# Patient Record
Sex: Male | Born: 1950 | Race: White | Hispanic: No | Marital: Single | Smoking: Never smoker
Health system: Southern US, Community
[De-identification: ages and names within clinical notes are randomized; demographics above are authoritative.]

## PROBLEM LIST (undated history)

## (undated) DIAGNOSIS — Z87440 Personal history of urinary (tract) infections: Secondary | ICD-10-CM

## (undated) DIAGNOSIS — N401 Enlarged prostate with lower urinary tract symptoms: Secondary | ICD-10-CM

## (undated) DIAGNOSIS — N400 Enlarged prostate without lower urinary tract symptoms: Secondary | ICD-10-CM

## (undated) DIAGNOSIS — N138 Other obstructive and reflux uropathy: Secondary | ICD-10-CM

## (undated) DIAGNOSIS — I351 Nonrheumatic aortic (valve) insufficiency: Secondary | ICD-10-CM

## (undated) HISTORY — PX: APPENDECTOMY: SHX54

## (undated) HISTORY — PX: BASAL CELL CARCINOMA EXCISION: SHX1214

## (undated) HISTORY — PX: HERNIA REPAIR: SHX51

## (undated) HISTORY — PX: SQUAMOUS CELL CARCINOMA EXCISION: SHX2433

---

## 2016-04-14 DIAGNOSIS — Z85828 Personal history of other malignant neoplasm of skin: Secondary | ICD-10-CM | POA: Diagnosis not present

## 2016-04-14 DIAGNOSIS — L570 Actinic keratosis: Secondary | ICD-10-CM | POA: Diagnosis not present

## 2016-04-14 DIAGNOSIS — L2081 Atopic neurodermatitis: Secondary | ICD-10-CM | POA: Diagnosis not present

## 2016-04-14 DIAGNOSIS — L111 Transient acantholytic dermatosis [Grover]: Secondary | ICD-10-CM | POA: Diagnosis not present

## 2016-04-14 DIAGNOSIS — Z08 Encounter for follow-up examination after completed treatment for malignant neoplasm: Secondary | ICD-10-CM | POA: Diagnosis not present

## 2016-04-14 DIAGNOSIS — X32XXXS Exposure to sunlight, sequela: Secondary | ICD-10-CM | POA: Diagnosis not present

## 2016-06-28 DIAGNOSIS — L301 Dyshidrosis [pompholyx]: Secondary | ICD-10-CM | POA: Diagnosis not present

## 2016-07-19 DIAGNOSIS — K219 Gastro-esophageal reflux disease without esophagitis: Secondary | ICD-10-CM | POA: Diagnosis not present

## 2016-07-19 DIAGNOSIS — Z8371 Family history of colonic polyps: Secondary | ICD-10-CM | POA: Diagnosis not present

## 2016-07-19 DIAGNOSIS — Z01818 Encounter for other preprocedural examination: Secondary | ICD-10-CM | POA: Diagnosis not present

## 2016-09-01 DIAGNOSIS — K210 Gastro-esophageal reflux disease with esophagitis: Secondary | ICD-10-CM | POA: Diagnosis not present

## 2016-09-01 DIAGNOSIS — K219 Gastro-esophageal reflux disease without esophagitis: Secondary | ICD-10-CM | POA: Diagnosis not present

## 2016-09-01 DIAGNOSIS — K2970 Gastritis, unspecified, without bleeding: Secondary | ICD-10-CM | POA: Diagnosis not present

## 2016-09-01 DIAGNOSIS — K209 Esophagitis, unspecified: Secondary | ICD-10-CM | POA: Diagnosis not present

## 2016-09-01 DIAGNOSIS — K644 Residual hemorrhoidal skin tags: Secondary | ICD-10-CM | POA: Diagnosis not present

## 2016-09-01 DIAGNOSIS — Z8371 Family history of colonic polyps: Secondary | ICD-10-CM | POA: Diagnosis not present

## 2016-09-01 DIAGNOSIS — K635 Polyp of colon: Secondary | ICD-10-CM | POA: Diagnosis not present

## 2016-09-01 DIAGNOSIS — K229 Disease of esophagus, unspecified: Secondary | ICD-10-CM | POA: Diagnosis not present

## 2016-09-01 DIAGNOSIS — Z1211 Encounter for screening for malignant neoplasm of colon: Secondary | ICD-10-CM | POA: Diagnosis not present

## 2016-09-01 DIAGNOSIS — D120 Benign neoplasm of cecum: Secondary | ICD-10-CM | POA: Diagnosis not present

## 2016-09-01 DIAGNOSIS — K222 Esophageal obstruction: Secondary | ICD-10-CM | POA: Diagnosis not present

## 2016-09-26 DIAGNOSIS — Z8371 Family history of colonic polyps: Secondary | ICD-10-CM | POA: Diagnosis not present

## 2016-09-26 DIAGNOSIS — K219 Gastro-esophageal reflux disease without esophagitis: Secondary | ICD-10-CM | POA: Diagnosis not present

## 2016-09-26 DIAGNOSIS — K222 Esophageal obstruction: Secondary | ICD-10-CM | POA: Diagnosis not present

## 2016-09-26 DIAGNOSIS — Z86010 Personal history of colonic polyps: Secondary | ICD-10-CM | POA: Diagnosis not present

## 2016-09-26 DIAGNOSIS — K2970 Gastritis, unspecified, without bleeding: Secondary | ICD-10-CM | POA: Diagnosis not present

## 2016-09-26 DIAGNOSIS — K449 Diaphragmatic hernia without obstruction or gangrene: Secondary | ICD-10-CM | POA: Diagnosis not present

## 2016-09-26 DIAGNOSIS — K210 Gastro-esophageal reflux disease with esophagitis: Secondary | ICD-10-CM | POA: Diagnosis not present

## 2016-12-06 DIAGNOSIS — B349 Viral infection, unspecified: Secondary | ICD-10-CM | POA: Diagnosis not present

## 2016-12-06 DIAGNOSIS — Z2889 Immunization not carried out for other reason: Secondary | ICD-10-CM | POA: Diagnosis not present

## 2016-12-06 DIAGNOSIS — Z6823 Body mass index (BMI) 23.0-23.9, adult: Secondary | ICD-10-CM | POA: Diagnosis not present

## 2017-01-03 DIAGNOSIS — L570 Actinic keratosis: Secondary | ICD-10-CM | POA: Diagnosis not present

## 2017-01-03 DIAGNOSIS — Z08 Encounter for follow-up examination after completed treatment for malignant neoplasm: Secondary | ICD-10-CM | POA: Diagnosis not present

## 2017-01-03 DIAGNOSIS — L853 Xerosis cutis: Secondary | ICD-10-CM | POA: Diagnosis not present

## 2017-01-03 DIAGNOSIS — Z85828 Personal history of other malignant neoplasm of skin: Secondary | ICD-10-CM | POA: Diagnosis not present

## 2017-01-03 DIAGNOSIS — L2081 Atopic neurodermatitis: Secondary | ICD-10-CM | POA: Diagnosis not present

## 2017-03-28 DIAGNOSIS — H9193 Unspecified hearing loss, bilateral: Secondary | ICD-10-CM | POA: Diagnosis not present

## 2017-03-28 DIAGNOSIS — Z6822 Body mass index (BMI) 22.0-22.9, adult: Secondary | ICD-10-CM | POA: Diagnosis not present

## 2017-03-28 DIAGNOSIS — Z0000 Encounter for general adult medical examination without abnormal findings: Secondary | ICD-10-CM | POA: Diagnosis not present

## 2017-04-04 DIAGNOSIS — D649 Anemia, unspecified: Secondary | ICD-10-CM | POA: Diagnosis not present

## 2017-04-14 DIAGNOSIS — R300 Dysuria: Secondary | ICD-10-CM | POA: Diagnosis not present

## 2017-04-14 DIAGNOSIS — Z6823 Body mass index (BMI) 23.0-23.9, adult: Secondary | ICD-10-CM | POA: Diagnosis not present

## 2017-04-14 DIAGNOSIS — N410 Acute prostatitis: Secondary | ICD-10-CM | POA: Diagnosis not present

## 2017-07-10 DIAGNOSIS — J040 Acute laryngitis: Secondary | ICD-10-CM | POA: Diagnosis not present

## 2017-07-10 DIAGNOSIS — K219 Gastro-esophageal reflux disease without esophagitis: Secondary | ICD-10-CM | POA: Diagnosis not present

## 2017-07-10 DIAGNOSIS — H6123 Impacted cerumen, bilateral: Secondary | ICD-10-CM | POA: Diagnosis not present

## 2017-08-18 ENCOUNTER — Encounter (HOSPITAL_COMMUNITY): Payer: Self-pay

## 2017-08-18 ENCOUNTER — Emergency Department (HOSPITAL_COMMUNITY): Payer: Federal, State, Local not specified - PPO

## 2017-08-18 ENCOUNTER — Other Ambulatory Visit: Payer: Self-pay

## 2017-08-18 ENCOUNTER — Observation Stay (HOSPITAL_COMMUNITY)
Admission: EM | Admit: 2017-08-18 | Discharge: 2017-08-20 | Disposition: A | Discharge status: Home / Self Care | Payer: Federal, State, Local not specified - PPO | Attending: Internal Medicine | Admitting: Internal Medicine

## 2017-08-18 DIAGNOSIS — Z79899 Other long term (current) drug therapy: Secondary | ICD-10-CM | POA: Insufficient documentation

## 2017-08-18 DIAGNOSIS — N401 Enlarged prostate with lower urinary tract symptoms: Secondary | ICD-10-CM | POA: Insufficient documentation

## 2017-08-18 DIAGNOSIS — M7989 Other specified soft tissue disorders: Secondary | ICD-10-CM | POA: Diagnosis not present

## 2017-08-18 DIAGNOSIS — R600 Localized edema: Secondary | ICD-10-CM | POA: Diagnosis present

## 2017-08-18 DIAGNOSIS — R030 Elevated blood-pressure reading, without diagnosis of hypertension: Secondary | ICD-10-CM | POA: Diagnosis present

## 2017-08-18 DIAGNOSIS — I351 Nonrheumatic aortic (valve) insufficiency: Secondary | ICD-10-CM | POA: Diagnosis not present

## 2017-08-18 DIAGNOSIS — K219 Gastro-esophageal reflux disease without esophagitis: Secondary | ICD-10-CM | POA: Insufficient documentation

## 2017-08-18 DIAGNOSIS — N1330 Unspecified hydronephrosis: Principal | ICD-10-CM | POA: Diagnosis present

## 2017-08-18 DIAGNOSIS — R3915 Urgency of urination: Secondary | ICD-10-CM | POA: Diagnosis not present

## 2017-08-18 DIAGNOSIS — R9431 Abnormal electrocardiogram [ECG] [EKG]: Secondary | ICD-10-CM | POA: Diagnosis not present

## 2017-08-18 DIAGNOSIS — R339 Retention of urine, unspecified: Secondary | ICD-10-CM | POA: Diagnosis present

## 2017-08-18 DIAGNOSIS — N320 Bladder-neck obstruction: Secondary | ICD-10-CM | POA: Insufficient documentation

## 2017-08-18 DIAGNOSIS — R338 Other retention of urine: Secondary | ICD-10-CM | POA: Diagnosis not present

## 2017-08-18 DIAGNOSIS — Z91040 Latex allergy status: Secondary | ICD-10-CM | POA: Diagnosis not present

## 2017-08-18 DIAGNOSIS — R300 Dysuria: Secondary | ICD-10-CM | POA: Insufficient documentation

## 2017-08-18 DIAGNOSIS — N134 Hydroureter: Secondary | ICD-10-CM | POA: Insufficient documentation

## 2017-08-18 DIAGNOSIS — N400 Enlarged prostate without lower urinary tract symptoms: Secondary | ICD-10-CM | POA: Diagnosis present

## 2017-08-18 HISTORY — DX: Benign prostatic hyperplasia with lower urinary tract symptoms: N40.1

## 2017-08-18 HISTORY — DX: Benign prostatic hyperplasia without lower urinary tract symptoms: N40.0

## 2017-08-18 HISTORY — DX: Nonrheumatic aortic (valve) insufficiency: I35.1

## 2017-08-18 HISTORY — DX: Other obstructive and reflux uropathy: N13.8

## 2017-08-18 HISTORY — DX: Personal history of urinary (tract) infections: Z87.440

## 2017-08-18 LAB — CBC
HEMATOCRIT: 34.3 % — AB (ref 39.0–52.0)
HEMOGLOBIN: 11.3 g/dL — AB (ref 13.0–17.0)
MCH: 31 pg (ref 26.0–34.0)
MCHC: 32.9 g/dL (ref 30.0–36.0)
MCV: 94.2 fL (ref 78.0–100.0)
Platelets: 246 10*3/uL (ref 150–400)
RBC: 3.64 MIL/uL — ABNORMAL LOW (ref 4.22–5.81)
RDW: 12.5 % (ref 11.5–15.5)
WBC: 5.8 10*3/uL (ref 4.0–10.5)

## 2017-08-18 LAB — BASIC METABOLIC PANEL
ANION GAP: 8 (ref 5–15)
BUN: 28 mg/dL — ABNORMAL HIGH (ref 6–20)
CO2: 22 mmol/L (ref 22–32)
Calcium: 8.8 mg/dL — ABNORMAL LOW (ref 8.9–10.3)
Chloride: 108 mmol/L (ref 101–111)
Creatinine, Ser: 1.1 mg/dL (ref 0.61–1.24)
GFR calc Af Amer: >60 mL/min (ref >60)
GFR calc non Af Amer: >60 mL/min (ref >60)
GLUCOSE: 89 mg/dL (ref 65–99)
POTASSIUM: 4.3 mmol/L (ref 3.5–5.1)
Sodium: 138 mmol/L (ref 135–145)

## 2017-08-18 LAB — D-DIMER, QUANTITATIVE: D-Dimer, Quant: 0.41 ug/mL-FEU (ref 0.00–0.50)

## 2017-08-18 LAB — I-STAT TROPONIN, ED: Troponin i, poc: 0 ng/mL (ref 0.00–0.08)

## 2017-08-18 MED ORDER — IOPAMIDOL (ISOVUE-300) INJECTION 61%
100.0000 mL | Freq: Once | INTRAVENOUS | Status: AC | PRN
Start: 2017-08-18 — End: 2017-08-18
  Administered 2017-08-18: 100 mL via INTRAVENOUS

## 2017-08-18 NOTE — ED Notes (Addendum)
Pt is alert and oriented x 4 and is verbally responsive. Pt has left leg swelling that began 3 days ago. Left leg appears edematous no pitting, and is red, warm with touch pt does report that there was some red spots. Pt was referred by urgent care. Pt denies pain at this time.

## 2017-08-18 NOTE — ED Triage Notes (Signed)
Patient presents with left leg swelling, starting 08/12/17. Patient reports the leg has gradually gotten bigger over the past week. Patient denies any long periods of sitting/travel over the past week. Patient denies leg pain. Patient endorses shortness of breath over the past couple of days, but denies SOB at this time. Patient denies chest pain.

## 2017-08-18 NOTE — ED Provider Notes (Signed)
Medical screening examination/treatment/procedure(s) were conducted as a shared visit with non-physician practitioner(s) and myself.  I personally evaluated the patient during the encounter.   EKG Interpretation  Date/Time:  Friday August 18 2017 20:34:14 EST Ventricular Rate:  57 PR Interval:    QRS Duration: 88 QT Interval:  452 QTC Calculation: 441 R Axis:   60 Text Interpretation:  Sinus rhythm Minimal ST elevation, inferior leads No previous ECGs available Confirmed by Fredia Sorrow [phone]) on 08/18/2017 9:53:34 PM       Results for orders placed or performed during the hospital encounter of 72/53/66  Basic metabolic panel  Result Value Ref Range   Sodium 138 135 - 145 mmol/L   Potassium 4.3 3.5 - 5.1 mmol/L   Chloride 108 101 - 111 mmol/L   CO2 22 22 - 32 mmol/L   Glucose, Bld 89 65 - 99 mg/dL   BUN 28 (H) 6 - 20 mg/dL   Creatinine, Ser 1.10 0.61 - 1.24 mg/dL   Calcium 8.8 (L) 8.9 - 10.3 mg/dL   GFR calc non Af Amer >60 >60 mL/min   GFR calc Af Amer >60 >60 mL/min   Anion gap 8 5 - 15  CBC  Result Value Ref Range   WBC 5.8 4.0 - 10.5 K/uL   RBC 3.64 (L) 4.22 - 5.81 MIL/uL   Hemoglobin 11.3 (L) 13.0 - 17.0 g/dL   HCT 34.3 (L) 39.0 - 52.0 %   MCV 94.2 78.0 - 100.0 fL   MCH 31.0 26.0 - 34.0 pg   MCHC 32.9 30.0 - 36.0 g/dL   RDW 12.5 11.5 - 15.5 %   Platelets 246 150 - 400 K/uL  D-dimer, quantitative (not at [facility])  Result Value Ref Range   D-Dimer, Quant 0.41 0.00 - 0.50 ug/mL-FEU  I-stat troponin, ED  Result Value Ref Range   Troponin i, poc 0.00 0.00 - 0.08 ng/mL   Comment 3           Dg Chest 2 View  Result Date: 08/18/2017 CLINICAL DATA:  Leg swelling EXAM: CHEST  2 VIEW COMPARISON:  None. FINDINGS: The heart size and mediastinal contours are within normal limits. Both lungs are clear. Degenerative osteophytes of the spine. IMPRESSION: No active cardiopulmonary disease. Electronically Signed   By: Donavan Foil M.D.   On: 08/18/2017 21:03   Ct Abdomen  Pelvis W Contrast  Result Date: 08/18/2017 CLINICAL DATA:  Leg swelling EXAM: CT ABDOMEN AND PELVIS WITH CONTRAST TECHNIQUE: Multidetector CT imaging of the abdomen and pelvis was performed using the standard protocol following bolus administration of intravenous contrast. CONTRAST:  134mL ISOVUE-300 IOPAMIDOL (ISOVUE-300) INJECTION 61% COMPARISON:  None. FINDINGS: Lower chest: Lung bases demonstrate no acute consolidation or pleural effusion. Normal heart size. Hepatobiliary: No focal liver abnormality is seen. No gallstones, gallbladder wall thickening, or biliary dilatation. Pancreas: Unremarkable. No pancreatic ductal dilatation or surrounding inflammatory changes. Spleen: Normal in size without focal abnormality. Adrenals/Urinary Tract: Adrenal glands are within normal limits. Severe bilateral hydronephrosis and hydroureter, right greater than left. Massive over distention of the bladder which extends into the upper abdomen. There is displacement of the bowel to the periphery by the markedly enlarged bladder. Stomach/Bowel: Stomach within normal limits. No dilated small bowel. No colon wall thickening. Vascular/Lymphatic: Nonaneurysmal aorta. Atherosclerosis. No significantly enlarged lymph nodes Reproductive: Prostate enlargement. Somewhat infiltrative appearing soft tissue density at the base of the bladder, appears contiguous with prostate gland. Other: Negative for free air or ascites. Diffuse subcutaneous edema within the  pelvis and proximal thighs. Musculoskeletal: No acute or significant osseous findings. IMPRESSION: 1. Massive distension of the urinary bladder, resulting in severe bilateral hydronephrosis and hydroureter. Findings would be concerning for outlet obstruction. Somewhat infiltrating appearing mass or soft tissue density at the bladder base which appears contiguous with the prostate gland for which clinical correlation is recommended. 2. There are no other acute abnormalities visualized.  Electronically Signed   By: Donavan Foil M.D.   On: 08/18/2017 23:33   Patient seen by me along with the physician assistant.  Patient presented with about a 1 week history of leg swelling.  About 3 days ago the left leg started to swell more than the right.  There is also some redness to that area.  But no pain.  Patient denies any abdominal pain although her abdomen is distended patient has no chest pain or shortness of breath.  Patient recently moved to the area and has been assigned a primary care provider but does not have an appointment until January.  Patient denies any fevers denies any nausea or vomiting.   Clinically looked as if there was bilateral leg edema left greater than right some erythema probably secondary to the swelling.  No tenderness no increased warmth.  Good cap refill distally abdomen was markedly distended with a mass feeling in the lower part of the abdomen.  Heart regular lungs clear.  Initial labs showed no leukocytosis showed some elevation in BUN creatinine relatively normal.  D-dimer was negative so unlikely to be DVT.  Chest x-ray was negative showed no pulmonary edema or fluid.  Based on the abdominal findings recommended CT scan.  CT scan shows a markedly dilated and enlarged bladder.  Foley catheter will need to be placed.  There was severe bilateral hydronephrosis and hydroureter.  Some question of perhaps prostate neoplastic process with spread outside the prostate.  Feel that patient will require Foley catheter and admission by hospitalist team and probably consultation by urology.   Fredia Sorrow, MD 08/18/17 [phone]

## 2017-08-19 ENCOUNTER — Inpatient Hospital Stay (HOSPITAL_COMMUNITY): Payer: Federal, State, Local not specified - PPO

## 2017-08-19 ENCOUNTER — Encounter (HOSPITAL_COMMUNITY): Payer: Self-pay

## 2017-08-19 DIAGNOSIS — N1330 Unspecified hydronephrosis: Secondary | ICD-10-CM | POA: Diagnosis not present

## 2017-08-19 DIAGNOSIS — R3915 Urgency of urination: Secondary | ICD-10-CM | POA: Diagnosis not present

## 2017-08-19 DIAGNOSIS — R600 Localized edema: Secondary | ICD-10-CM | POA: Diagnosis present

## 2017-08-19 DIAGNOSIS — I351 Nonrheumatic aortic (valve) insufficiency: Secondary | ICD-10-CM | POA: Diagnosis not present

## 2017-08-19 DIAGNOSIS — N400 Enlarged prostate without lower urinary tract symptoms: Secondary | ICD-10-CM | POA: Diagnosis present

## 2017-08-19 DIAGNOSIS — N401 Enlarged prostate with lower urinary tract symptoms: Secondary | ICD-10-CM | POA: Diagnosis not present

## 2017-08-19 DIAGNOSIS — R339 Retention of urine, unspecified: Secondary | ICD-10-CM | POA: Diagnosis present

## 2017-08-19 DIAGNOSIS — N134 Hydroureter: Secondary | ICD-10-CM | POA: Diagnosis not present

## 2017-08-19 DIAGNOSIS — R300 Dysuria: Secondary | ICD-10-CM | POA: Diagnosis not present

## 2017-08-19 DIAGNOSIS — N3289 Other specified disorders of bladder: Secondary | ICD-10-CM | POA: Diagnosis not present

## 2017-08-19 DIAGNOSIS — Z79899 Other long term (current) drug therapy: Secondary | ICD-10-CM | POA: Diagnosis not present

## 2017-08-19 DIAGNOSIS — R030 Elevated blood-pressure reading, without diagnosis of hypertension: Secondary | ICD-10-CM | POA: Diagnosis present

## 2017-08-19 DIAGNOSIS — Z91040 Latex allergy status: Secondary | ICD-10-CM | POA: Diagnosis not present

## 2017-08-19 DIAGNOSIS — K219 Gastro-esophageal reflux disease without esophagitis: Secondary | ICD-10-CM | POA: Diagnosis not present

## 2017-08-19 DIAGNOSIS — N320 Bladder-neck obstruction: Secondary | ICD-10-CM

## 2017-08-19 DIAGNOSIS — N138 Other obstructive and reflux uropathy: Secondary | ICD-10-CM | POA: Diagnosis not present

## 2017-08-19 DIAGNOSIS — R39198 Other difficulties with micturition: Secondary | ICD-10-CM | POA: Diagnosis not present

## 2017-08-19 LAB — URINALYSIS, ROUTINE W REFLEX MICROSCOPIC
Bilirubin Urine: NEGATIVE
GLUCOSE, UA: NEGATIVE mg/dL
HGB URINE DIPSTICK: NEGATIVE
KETONES UR: NEGATIVE mg/dL
Leukocytes, UA: NEGATIVE
Nitrite: NEGATIVE
PROTEIN: NEGATIVE mg/dL
Specific Gravity, Urine: 1.016 (ref 1.005–1.030)
pH: 6 (ref 5.0–8.0)

## 2017-08-19 LAB — CBC
HEMATOCRIT: 32.7 % — AB (ref 39.0–52.0)
HEMOGLOBIN: 10.7 g/dL — AB (ref 13.0–17.0)
MCH: 30.7 pg (ref 26.0–34.0)
MCHC: 32.7 g/dL (ref 30.0–36.0)
MCV: 93.7 fL (ref 78.0–100.0)
Platelets: 209 10*3/uL (ref 150–400)
RBC: 3.49 MIL/uL — AB (ref 4.22–5.81)
RDW: 12.5 % (ref 11.5–15.5)
WBC: 5.2 10*3/uL (ref 4.0–10.5)

## 2017-08-19 LAB — BASIC METABOLIC PANEL
ANION GAP: 7 (ref 5–15)
BUN: 25 mg/dL — ABNORMAL HIGH (ref 6–20)
CO2: 22 mmol/L (ref 22–32)
Calcium: 8.4 mg/dL — ABNORMAL LOW (ref 8.9–10.3)
Chloride: 109 mmol/L (ref 101–111)
Creatinine, Ser: 1.09 mg/dL (ref 0.61–1.24)
GFR calc Af Amer: >60 mL/min (ref >60)
GLUCOSE: 90 mg/dL (ref 65–99)
POTASSIUM: 3.8 mmol/L (ref 3.5–5.1)
Sodium: 138 mmol/L (ref 135–145)

## 2017-08-19 LAB — ABO/RH: ABO/RH(D): A POS

## 2017-08-19 LAB — PSA: PROSTATIC SPECIFIC ANTIGEN: 3.25 ng/mL (ref 0.00–4.00)

## 2017-08-19 LAB — TYPE AND SCREEN
ABO/RH(D): A POS
Antibody Screen: NEGATIVE

## 2017-08-19 LAB — PROTIME-INR
INR: 1.06
Prothrombin Time: 13.7 seconds (ref 11.4–15.2)

## 2017-08-19 LAB — APTT: APTT: 31 s (ref 24–36)

## 2017-08-19 LAB — C-REACTIVE PROTEIN

## 2017-08-19 LAB — SEDIMENTATION RATE: Sed Rate: 11 mm/hr (ref 0–16)

## 2017-08-19 LAB — GLUCOSE, CAPILLARY: GLUCOSE-CAPILLARY: 95 mg/dL (ref 65–99)

## 2017-08-19 LAB — BRAIN NATRIURETIC PEPTIDE: B NATRIURETIC PEPTIDE 5: 50.4 pg/mL (ref 0.0–100.0)

## 2017-08-19 MED ORDER — ONDANSETRON HCL 4 MG PO TABS: 4.0000 mg | ORAL_TABLET | Freq: Four times a day (QID) | ORAL | Status: DC | PRN

## 2017-08-19 MED ORDER — ACETAMINOPHEN 650 MG RE SUPP: 650.0000 mg | Freq: Four times a day (QID) | RECTAL | Status: DC | PRN

## 2017-08-19 MED ORDER — ZOLPIDEM TARTRATE 5 MG PO TABS: 5.0000 mg | ORAL_TABLET | Freq: Every evening | ORAL | Status: DC | PRN

## 2017-08-19 MED ORDER — HEPARIN SODIUM (PORCINE) 5000 UNIT/ML IJ SOLN
5000.0000 [IU] | Freq: Three times a day (TID) | INTRAMUSCULAR | Status: DC
  Administered 2017-08-19 – 2017-08-20 (×4): 5000 [IU] via SUBCUTANEOUS
  Filled 2017-08-19 (×4): qty 1

## 2017-08-19 MED ORDER — ONDANSETRON HCL 4 MG/2ML IJ SOLN: 4.0000 mg | Freq: Four times a day (QID) | INTRAMUSCULAR | Status: DC | PRN

## 2017-08-19 MED ORDER — TAMSULOSIN HCL 0.4 MG PO CAPS
0.4000 mg | ORAL_CAPSULE | Freq: Every day | ORAL | Status: DC
  Administered 2017-08-19 (×2): 0.4 mg via ORAL
  Filled 2017-08-19 (×2): qty 1

## 2017-08-19 MED ORDER — ADULT MULTIVITAMIN W/MINERALS CH
1.0000 | ORAL_TABLET | Freq: Every day | ORAL | Status: DC
  Administered 2017-08-19 – 2017-08-20 (×2): 1 via ORAL
  Filled 2017-08-19 (×2): qty 1

## 2017-08-19 MED ORDER — FAMOTIDINE 20 MG PO TABS
20.0000 mg | ORAL_TABLET | Freq: Two times a day (BID) | ORAL | Status: DC
  Administered 2017-08-19 – 2017-08-20 (×4): 20 mg via ORAL
  Filled 2017-08-19 (×4): qty 1

## 2017-08-19 MED ORDER — HYDRALAZINE HCL 20 MG/ML IJ SOLN: 5.0000 mg | INTRAMUSCULAR | Status: DC | PRN

## 2017-08-19 MED ORDER — ACETAMINOPHEN 325 MG PO TABS: 650.0000 mg | ORAL_TABLET | Freq: Four times a day (QID) | ORAL | Status: DC | PRN

## 2017-08-19 NOTE — ED Provider Notes (Signed)
[facility] DEPT Provider Note   CSN: [id number] Arrival date & time: 08/18/17  1858     History   Chief Complaint Chief Complaint  Patient presents with  . Leg Swelling    left    HPI Stanley Schneider is a 66 y.o. male resenting with leg swelling.  Patient states that on Saturday, he started to notice that his left leg was swollen.  Since then, it is gradually gotten more swollen and red.  He denies history of similar.  He denies fall, trauma, or injury.  He states it is not painful.  He has never had a DVT before, denies recent travel, hormone use, immobilization, or surgery.  He denies history of cancer.  He denies fevers, chills, nausea, or vomiting.  He reports intermittent shortness of breath, this is been going on for a while.  It is worse when he first gets up and moving, but improves with continued activity.  Shortness of breath is worse when lying flat.  He denies history of CHF or other cardiac history.  He denies chest pain, cough, abdominal pain.  He reports some increased difficulty urinating recently, and abnormal sensation with bowel movements.  He denies dysuria, hematuria, or blood in stool.  He states his only medical history is an enlarged prostate and aortic regurg.  He does not take any medications daily.  He recently moved to the area, has a primary care appointment set up in January to establish care.   HPI  Past Medical History:  Diagnosis Date  . Aortic regurgitation   . Enlarged prostate     Patient Active Problem List   Diagnosis Date Noted  . Hydronephrosis due to obstruction of bladder 08/19/2017  . Bilateral leg edema 08/19/2017  . BPH (benign prostatic hyperplasia) 08/19/2017  . Elevated blood pressure reading 08/19/2017  . Urinary retention 08/19/2017    Past Surgical History:  Procedure Laterality Date  . APPENDECTOMY    . HERNIA REPAIR Left    inguinal       Home Medications    Prior to Admission  medications   Medication Sig Start Date End Date Taking? Authorizing Provider  famotidine (PEPCID) 20 MG tablet Take 20 mg by mouth 2 (two) times daily.   Yes [provider]  Multiple Vitamins-Minerals (MULTIVITAMIN WITH MINERALS) tablet Take 1 tablet by mouth daily.   Yes [provider]    Family History No family history on file.  Social History Social History   Tobacco Use  . Smoking status: Never Smoker  . Smokeless tobacco: Never Used  Substance Use Topics  . Alcohol use: Yes    Alcohol/week: 3.0 oz    Types: 5 Cans of beer per week  . Drug use: No     Allergies   Latex   Review of Systems Review of Systems  Respiratory: Positive for shortness of breath (intermittent, not currently).   Cardiovascular: Positive for leg swelling.  Genitourinary: Positive for difficulty urinating.  All other systems reviewed and are negative.    Physical Exam Updated Vital Signs BP (!) 171/92   Pulse 61   Temp 98 F (36.7 C) (Oral)   Resp 18   SpO2 100%   Physical Exam  Constitutional: He is oriented to person, place, and time. He appears well-developed and well-nourished. No distress.  HENT:  Head: Normocephalic and atraumatic.  Eyes: EOM are normal.  Neck: Normal range of motion.  Cardiovascular: Normal rate, regular rhythm and intact distal pulses.  Pulmonary/Chest: Effort normal and breath sounds normal. No respiratory distress. He has no wheezes.  Abdominal: Soft. There is no tenderness. There is no rebound and no guarding.  Musculoskeletal: He exhibits edema.  Bilateral 2+ pitting edema, left worse than right.  Left leg erythematous, right upper medial thigh erythematous.  It is nonpainful.  Pedal pulses intact bilaterally.  Good cap refill.  Soft compartments.  Patient is ambulatory.  Neurological: He is alert and oriented to person, place, and time.  Skin: Skin is warm and dry.  Psychiatric: He has a normal mood and affect.  Nursing note and  vitals reviewed.    ED Treatments / Results  Labs (all labs ordered are listed, but only abnormal results are displayed) Labs Reviewed  BASIC METABOLIC PANEL - Abnormal; Notable for the following components:      Result Value   BUN 28 (*)    Calcium 8.8 (*)    All other components within normal limits  CBC - Abnormal; Notable for the following components:   RBC 3.64 (*)    Hemoglobin 11.3 (*)    HCT 34.3 (*)    All other components within normal limits  URINE CULTURE  D-DIMER, QUANTITATIVE (NOT AT [facility])  PROTIME-INR  APTT  BRAIN NATRIURETIC PEPTIDE  URINALYSIS, ROUTINE W REFLEX MICROSCOPIC  BASIC METABOLIC PANEL  CBC  I-STAT TROPONIN, ED  TYPE AND SCREEN    EKG  EKG Interpretation  Date/Time:  Friday August 18 2017 20:34:14 EST Ventricular Rate:  57 PR Interval:    QRS Duration: 88 QT Interval:  452 QTC Calculation: 441 R Axis:   60 Text Interpretation:  Sinus rhythm Minimal ST elevation, inferior leads No previous ECGs available Confirmed by Fredia Sorrow [phone]) on 08/18/2017 9:53:34 PM       Radiology Dg Chest 2 View  Result Date: 08/18/2017 CLINICAL DATA:  Leg swelling EXAM: CHEST  2 VIEW COMPARISON:  None. FINDINGS: The heart size and mediastinal contours are within normal limits. Both lungs are clear. Degenerative osteophytes of the spine. IMPRESSION: No active cardiopulmonary disease. Electronically Signed   By: Donavan Foil M.D.   On: 08/18/2017 21:03   Ct Abdomen Pelvis W Contrast  Result Date: 08/18/2017 CLINICAL DATA:  Leg swelling EXAM: CT ABDOMEN AND PELVIS WITH CONTRAST TECHNIQUE: Multidetector CT imaging of the abdomen and pelvis was performed using the standard protocol following bolus administration of intravenous contrast. CONTRAST:  125mL ISOVUE-300 IOPAMIDOL (ISOVUE-300) INJECTION 61% COMPARISON:  None. FINDINGS: Lower chest: Lung bases demonstrate no acute consolidation or pleural effusion. Normal heart size. Hepatobiliary: No focal  liver abnormality is seen. No gallstones, gallbladder wall thickening, or biliary dilatation. Pancreas: Unremarkable. No pancreatic ductal dilatation or surrounding inflammatory changes. Spleen: Normal in size without focal abnormality. Adrenals/Urinary Tract: Adrenal glands are within normal limits. Severe bilateral hydronephrosis and hydroureter, right greater than left. Massive over distention of the bladder which extends into the upper abdomen. There is displacement of the bowel to the periphery by the markedly enlarged bladder. Stomach/Bowel: Stomach within normal limits. No dilated small bowel. No colon wall thickening. Vascular/Lymphatic: Nonaneurysmal aorta. Atherosclerosis. No significantly enlarged lymph nodes Reproductive: Prostate enlargement. Somewhat infiltrative appearing soft tissue density at the base of the bladder, appears contiguous with prostate gland. Other: Negative for free air or ascites. Diffuse subcutaneous edema within the pelvis and proximal thighs. Musculoskeletal: No acute or significant osseous findings. IMPRESSION: 1. Massive distension of the urinary bladder, resulting in severe bilateral hydronephrosis and hydroureter. Findings would be concerning for outlet  obstruction. Somewhat infiltrating appearing mass or soft tissue density at the bladder base which appears contiguous with the prostate gland for which clinical correlation is recommended. 2. There are no other acute abnormalities visualized. Electronically Signed   By: Donavan Foil M.D.   On: 08/18/2017 23:33    Procedures Procedures (including critical care time)  Medications Ordered in ED Medications  famotidine (PEPCID) tablet 20 mg (not administered)  multivitamin with minerals tablet 1 tablet (not administered)  tamsulosin (FLOMAX) capsule 0.4 mg (not administered)  heparin injection 5,000 Units (not administered)  acetaminophen (TYLENOL) tablet 650 mg (not administered)    Or  acetaminophen (TYLENOL)  suppository 650 mg (not administered)  ondansetron (ZOFRAN) tablet 4 mg (not administered)    Or  ondansetron (ZOFRAN) injection 4 mg (not administered)  hydrALAZINE (APRESOLINE) injection 5 mg (not administered)  zolpidem (AMBIEN) tablet 5 mg (not administered)  iopamidol (ISOVUE-300) 61 % injection 100 mL (100 mLs Intravenous Contrast Given 08/18/17 2246)     Initial Impression / Assessment and Plan / ED Course  I have reviewed the triage vital signs and the nursing notes.  Pertinent labs & imaging results that were available during my care of the patient were reviewed by me and considered in my medical decision making (see chart for details).     Patient presenting for evaluation of leg swelling.  Physical exam shows there is bilateral pitting edema, worse on the left.  Left leg and right upper thigh erythematous without significant pain.  Patient is neurovascularly intact.  Patient is afebrile not tachycardic.  Troponin negative.  BMP shows stable creatinine.  D-dimer negative.  No leukocytosis.  Case discussed with attending, Dr. Rogene Houston evaluated the patient.  Will obtain CT abdomen to ensure no obstruction causing edema.  CT abdomen shows severely distended bladder causing bilateral hydronephrosis and concern for infiltrating mass at the base of the bladder originating from the prostate.  Will place Foley, and consult with hospitalist for admission.  Discussed case with hospitalist, patient to be admitted.  Requested urology consult.  Discussed with Dr. Jeffie Pollock from urology, and patient will be followed while he is in the hospital.  Final Clinical Impressions(s) / ED Diagnoses   Final diagnoses:  Urinary retention  Bilateral hydronephrosis  Bilateral lower extremity edema  Bilateral leg edema    ED Discharge Orders    None       Franchot Heidelberg, PA-C 08/19/17 0050    Fredia Sorrow, MD 08/19/17 1749

## 2017-08-19 NOTE — Progress Notes (Signed)
Bilateral lower extremity venous duplex has been completed. Negative for DVT.  08/19/17 9:39 AM Carlos Levering RVT

## 2017-08-19 NOTE — Progress Notes (Signed)
Patient seen and examined. Admitted after midnight secondary to LE swelling and globus vesicalis; found to have severe distension of his bladder and bilateral hydronephrosis. hemodynamically stable and with preserved renal function. After discussing with urology, there is high concerns for malignancy causing obstruction (either bladder vs prostate for origin). Patient with over 2L fluids after foley catheter placement. Please refer to H&P written by Dr. Blaine Hamper for further info/details on admission. Will follow recommendations from Dr. Jeffie Pollock (urology service)  Barton Dubois MD [phone]

## 2017-08-19 NOTE — Consult Note (Signed)
Subjective: CC: left leg edema.  Hx:  Stanley Schneider is a 66 yo WM who I was asked to see in consultation by Dr. Dyann Kief.  He came to the hospital for left leg edema and on evaluation  was found on CT to have a massively dilated bladder with bilateral hydronephrosis and a mass at the base of the bladder.  He has had some right leg swelling as well and increasing fatigue.  He has had no bone pain.   He has had no hematuria.  He has had increased difficulty voiding over the last 3-4 months and was treated with Cipro for 14 days in [place] by his PCP in July and got a little better but it has been getting progressively worse with frequency, urgency and nocturia with a reduced stream.  He had hesitancy but no sensation of incomplete emptying.  He has a several year history of an enlarged prostate.   He thinks his PSA was around 2.8 in the summer.   He had not had one done in several years because of the turn against screening but it may have been up to 3 in the past.   He had been on Avodart in the past but hasn't been on it for 4 years because of gynecomastia.   He has no prior history of GU surgery.   He has several UTI's over last several years.   ROS:  Review of Systems  Constitutional: Positive for malaise/fatigue. Negative for fever and weight loss.  Respiratory: Positive for shortness of breath (mild).   Cardiovascular: Negative for chest pain.  Gastrointestinal: Positive for heartburn. Negative for abdominal pain, blood in stool, constipation, diarrhea, nausea and vomiting.  Genitourinary: Positive for frequency and urgency. Negative for dysuria, flank pain and hematuria.  Musculoskeletal: Negative for back pain, joint pain, myalgias and neck pain.  Neurological: Negative for sensory change, focal weakness and headaches.  Endo/Heme/Allergies: Negative for environmental allergies. Does not bruise/bleed easily.  All other systems reviewed and are negative.   Allergies  Allergen Reactions  .  Latex Rash    Past Medical History:  Diagnosis Date  . Aortic regurgitation   . BPH with obstruction/lower urinary tract symptoms   . Enlarged prostate   . Personal history of urinary (tract) infections     Past Surgical History:  Procedure Laterality Date  . APPENDECTOMY    . HERNIA REPAIR Left    inguinal    Social History   Socioeconomic History  . Marital status: Single    Spouse name: Not on file  . Number of children: Not on file  . Years of education: Not on file  . Highest education level: Not on file  Social Needs  . Financial resource strain: Not hard at all  . Food insecurity - worry: Never true  . Food insecurity - inability: Not on file  . Transportation needs - medical: No  . Transportation needs - non-medical: No  Occupational History  . Occupation: Social worker  Tobacco Use  . Smoking status: Never Smoker  . Smokeless tobacco: Never Used  Substance and Sexual Activity  . Alcohol use: Yes    Alcohol/week: 3.0 oz    Types: 5 Cans of beer per week  . Drug use: No  . Sexual activity: No  Other Topics Concern  . Not on file  Social History Narrative  . Not on file    Family History  Problem Relation Age of Onset  . Lung cancer Mother   . Heart  failure Father   . Hypertension Father   . Prostate cancer Father   . Breast cancer Sister   . Hypertension Sister     Anti-infectives: Anti-infectives (From admission, onward)   None      Current Facility-Administered Medications  Medication Dose Route Frequency Provider Last Rate Last Dose  . acetaminophen (TYLENOL) tablet 650 mg  650 mg Oral Q6H PRN Ivor Costa, MD       Or  . acetaminophen (TYLENOL) suppository 650 mg  650 mg Rectal Q6H PRN Ivor Costa, MD      . famotidine (PEPCID) tablet 20 mg  20 mg Oral BID Ivor Costa, MD   20 mg at 08/19/17 2426  . heparin injection 5,000 Units  5,000 Units Subcutaneous Cleophas Dunker, MD   5,000 Units at 08/19/17 8341  . hydrALAZINE (APRESOLINE) injection 5  mg  5 mg Intravenous Q2H PRN Ivor Costa, MD      . multivitamin with minerals tablet 1 tablet  1 tablet Oral Daily Ivor Costa, MD      . ondansetron [facility]) tablet 4 mg  4 mg Oral Q6H PRN Ivor Costa, MD       Or  . ondansetron [facility]) injection 4 mg  4 mg Intravenous Q6H PRN Ivor Costa, MD      . tamsulosin (FLOMAX) capsule 0.4 mg  0.4 mg Oral QPC supper Ivor Costa, MD   0.4 mg at 08/19/17 0225  . zolpidem (AMBIEN) tablet 5 mg  5 mg Oral QHS PRN Ivor Costa, MD         Objective: Vital signs in last 24 hours: Temp:  [98 F (36.7 C)-98.2 F (36.8 C)] 98.1 F (36.7 C) (12/01 0548) Pulse Rate:  [60-81] 81 (12/01 0548) Resp:  [18] 18 (12/01 0548) BP: (131-171)/(57-92) 131/57 (12/01 0548) SpO2:  [98 %-100 %] 98 % (12/01 0548) Weight:  [70.4 kg (155 lb 3.3 oz)] 70.4 kg (155 lb 3.3 oz) (12/01 0149)  Intake/Output from previous day: 11/30 0701 - 12/01 0700 In: 0  Out: 5300 [Urine:5300] Intake/Output this shift: No intake/output data recorded.   Physical Exam  Constitutional: He is oriented to person, place, and time and well-developed, well-nourished, and in no distress.  HENT:  Head: Normocephalic and atraumatic.  Neck: Normal range of motion. Neck supple. No thyromegaly present.  Cardiovascular: Normal rate, regular rhythm and normal heart sounds.  Pulmonary/Chest: Effort normal and breath sounds normal. No respiratory distress.  Abdominal: Soft. Bowel sounds are normal. He exhibits no distension and no mass. There is no tenderness.  Genitourinary:  Genitourinary Comments: Normal phallus with foley at the meatus. Scrotum, testes and epididymis are normal. A&P: external hemorrhoidal tags.  No masses. NST. Prostate 2+ benign without nodules. SV's non-palpable.   Musculoskeletal: Normal range of motion. He exhibits edema (Moderate left and mild right LE edema). He exhibits no tenderness.  Lymphadenopathy:    He has no cervical adenopathy.    He has no axillary adenopathy.        Right: No inguinal and no supraclavicular adenopathy present.       Left: No inguinal and no supraclavicular adenopathy present.  Neurological: He is alert and oriented to person, place, and time.  Skin: Skin is warm and dry.  Psychiatric: Mood and affect normal.    Lab Results:  Recent Labs    08/18/17 2035 08/19/17 0459  WBC 5.8 5.2  HGB 11.3* 10.7*  HCT 34.3* 32.7*  PLT 246 209   BMET Recent Labs  08/18/17 2035 08/19/17 0459  NA 138 138  K 4.3 3.8  CL 108 109  CO2 22 22  GLUCOSE 89 90  BUN 28* 25*  CREATININE 1.10 1.09  CALCIUM 8.8* 8.4*   PT/INR Recent Labs    08/19/17 0044  LABPROT 13.7  INR 1.06   ABG No results for input(s): PHART, HCO3 in the last 72 hours.  Invalid input(s): PCO2, PO2  Studies/Results: Dg Chest 2 View  Result Date: 08/18/2017 CLINICAL DATA:  Leg swelling EXAM: CHEST  2 VIEW COMPARISON:  None. FINDINGS: The heart size and mediastinal contours are within normal limits. Both lungs are clear. Degenerative osteophytes of the spine. IMPRESSION: No active cardiopulmonary disease. Electronically Signed   By: Donavan Foil M.D.   On: 08/18/2017 21:03   Ct Abdomen Pelvis W Contrast  Result Date: 08/18/2017 CLINICAL DATA:  Leg swelling EXAM: CT ABDOMEN AND PELVIS WITH CONTRAST TECHNIQUE: Multidetector CT imaging of the abdomen and pelvis was performed using the standard protocol following bolus administration of intravenous contrast. CONTRAST:  181mL ISOVUE-300 IOPAMIDOL (ISOVUE-300) INJECTION 61% COMPARISON:  None. FINDINGS: Lower chest: Lung bases demonstrate no acute consolidation or pleural effusion. Normal heart size. Hepatobiliary: No focal liver abnormality is seen. No gallstones, gallbladder wall thickening, or biliary dilatation. Pancreas: Unremarkable. No pancreatic ductal dilatation or surrounding inflammatory changes. Spleen: Normal in size without focal abnormality. Adrenals/Urinary Tract: Adrenal glands are within normal limits.  Severe bilateral hydronephrosis and hydroureter, right greater than left. Massive over distention of the bladder which extends into the upper abdomen. There is displacement of the bowel to the periphery by the markedly enlarged bladder. Stomach/Bowel: Stomach within normal limits. No dilated small bowel. No colon wall thickening. Vascular/Lymphatic: Nonaneurysmal aorta. Atherosclerosis. No significantly enlarged lymph nodes Reproductive: Prostate enlargement. Somewhat infiltrative appearing soft tissue density at the base of the bladder, appears contiguous with prostate gland. Other: Negative for free air or ascites. Diffuse subcutaneous edema within the pelvis and proximal thighs. Musculoskeletal: No acute or significant osseous findings. IMPRESSION: 1. Massive distension of the urinary bladder, resulting in severe bilateral hydronephrosis and hydroureter. Findings would be concerning for outlet obstruction. Somewhat infiltrating appearing mass or soft tissue density at the bladder base which appears contiguous with the prostate gland for which clinical correlation is recommended. 2. There are no other acute abnormalities visualized. Electronically Signed   By: Donavan Foil M.D.   On: 08/18/2017 23:33   I have reviewed the CT films and report, labs and hospital notes and discussed his case with Dr. Dyann Kief.   Assessment: 1.  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  The hydro could be secondary to the distended bladder but the right is more severe and could be secondary to the mass.   He has normal renal function.     I have ordered a PSA and urine cytology and will get a renal US tomorrow to see if the kidneys have decompress.      He is going to need a cystoscopy with TUR of prostate/bladder mass and the prostatic urethra to relieve obstruction.   If the PSA is high, he will need a transrectal ultrasound guided prostate biopsy.   If  he has persistent hydronephrosis, I will need to consider retrogrades and ureteral stenting.   I have reviewed the risks of the procedure including bleeding, infection, urethral and bladder injury with possible stricture formation, need for a stent or secondary procedures, thrombotic events and anesthetic complications.  I explained to him that he will probably need a foley for 2 or more months for the bladder to recover from the degree of over distention.   2.  Bilateral lower extremity edema, left > right.    He has had venous dopplers and the report is pending.   If the has a DVT, that will complicate the plans above and he may need a [place] filter so that we can proceed with the biopsies and resection.   CC: Dr. Barton Dubois.         Irine Seal 08/19/2017 [phone]

## 2017-08-19 NOTE — H&P (Signed)
History and Physical    Stanley Schneider HER:[id number] DOB: 11-Sep-1951 DOA: 08/18/2017  Referring MD/NP/PA:   PCP: Patient, No Pcp Per   Patient coming from:  The patient is coming from home.  At baseline, pt is independent for most of ADL.   Chief Complaint: bilateral leg edema, and difficult urinating  HPI: Stanley Schneider is a 66 y.o. male with medical history significant of BPH, aortic regurgitation, GERD, who presents with bilateral leg edema and difficulty urinating.  Pt states that he started to notice that his left leg was swollen since 08/12/17.  Since then, it is gradually gotten more swollen and red. He later noticed mild swelling in the right leg. Patient did not have any injury. No any tenderness in both legs. No recent long distant traveling. Patient does not have fever or chills. Patient also reports difficulty urinating recently. He has dysuria, and urinary urgency, but denies burning on urination. He has decreased urine output. Patient denies chest pain, shortness breath, no GI symptoms. No unilateral weakness.  ED Course: pt was found to have negative d-dimer, negative troponin, electrolytes renal function okay, temperature normal, no tachycardia, no tachypnea, oxygen saturation 100% on room air. Chest x-rays negative for acute abnormalities. Patient is admitted to [place] bed as inpatient. Urology, Dr. Jeffie Pollock was consulted.  CT abdomen/pelvis showed: 1. Massive distension of the urinary bladder, resulting in severe bilateral hydronephrosis and hydroureter. Findings would be concerning for outlet obstruction. Somewhat infiltrating appearing mass or soft tissue density at the bladder base which appears contiguous with the prostate gland 2. There are no other acute abnormalities visualized.  Review of Systems:   General: no fevers, chills, has fatigue HEENT: no blurry vision, hearing changes or sore throat Respiratory: no dyspnea, coughing, wheezing CV: no chest pain, no  palpitations GI: no nausea, vomiting, abdominal pain, diarrhea, constipation GU: has dysuria, no burning on urination, increased urinary frequency, hematuria. has difficult urinating. Ext: has leg edema Neuro: no unilateral weakness, numbness, or tingling, no vision change or hearing loss Skin: no rash, no skin tear. MSK: No muscle spasm, no deformity, no limitation of range of movement in spin Heme: No easy bruising.  Travel history: No recent long distant travel.  Allergy:  Allergies  Allergen Reactions  . Latex Rash    Past Medical History:  Diagnosis Date  . Aortic regurgitation   . Enlarged prostate     Past Surgical History:  Procedure Laterality Date  . APPENDECTOMY    . HERNIA REPAIR Left    inguinal    Social History:  reports that  has never smoked. he has never used smokeless tobacco. He reports that he drinks about 3.0 oz of alcohol per week. He reports that he does not use drugs.  Family History:  Family History  Problem Relation Age of Onset  . Lung cancer Mother   . Heart failure Father   . Hypertension Father   . Breast cancer Sister   . Hypertension Sister      Prior to Admission medications   Medication Sig Start Date End Date Taking? Authorizing Provider  famotidine (PEPCID) 20 MG tablet Take 20 mg by mouth 2 (two) times daily.   Yes [provider]  Multiple Vitamins-Minerals (MULTIVITAMIN WITH MINERALS) tablet Take 1 tablet by mouth daily.   Yes [provider]    Physical Exam: Vitals:   08/18/17 2018 08/19/17 0025 08/19/17 0030 08/19/17 0149  BP: (!) 165/79 (!) 169/90 (!) 171/92 (!) 158/61  Pulse: 68 60  61 70  Resp: _0 Temp: 98 F (36.7 C)   98.2 F (36.8 C)  TempSrc: Oral   Oral  SpO2: 100% 100% 100% 100%  Weight:    70.4 kg (155 lb 3.3 oz)  Height:    _1  (1.676 m)   General: Not in acute distress HEENT:       Eyes: PERRL, EOMI, no scleral icterus.       ENT: No discharge from the ears and nose, no  pharynx injection, no tonsillar enlargement.        Neck: No JVD, no bruit, no mass felt. Heme: No neck lymph node enlargement. Cardiac: S1/S2, RRR, No murmurs, No gallops or rubs. Respiratory: No rales, wheezing, rhonchi or rubs. GI: Soft, nondistended, nontender, no rebound pain, no organomegaly, BS present. GU: No hematuria Ext: 3+ pitting leg edema in the left leg and 1+ edema in the right leg. 2+DP/PT pulse bilaterally. Musculoskeletal: No joint deformities, No joint redness or warmth, no limitation of ROM in spin. Skin: No rashes. The left leg is swelling, erythematous from lower leg to upper inner thigh, with mild warmth; the right leg is mildly swelling, with erythema in the upper inner thigh, with mild warmth. Patient does not have any tenderness in legs. Neuro: Alert, oriented X3, cranial nerves II-XII grossly intact, moves all extremities normally. Psych: Patient is not psychotic, no suicidal or hemocidal ideation.  Labs on Admission: I have personally reviewed following labs and imaging studies  CBC: Recent Labs  Lab 08/18/17 2035 08/19/17 0459  WBC 5.8 5.2  HGB 11.3* 10.7*  HCT 34.3* 32.7*  MCV 94.2 93.7  PLT 246 563   Basic Metabolic Panel: Recent Labs  Lab 08/18/17 2035  NA 138  K 4.3  CL 108  CO2 22  GLUCOSE 89  BUN 28*  CREATININE 1.10  CALCIUM 8.8*   GFR: Estimated Creatinine Clearance: 58 mL/min (by C-G formula based on SCr of 1.1 mg/dL). Liver Function Tests: No results for input(s): AST, ALT, ALKPHOS, BILITOT, PROT, ALBUMIN in the last 168 hours. No results for input(s): LIPASE, AMYLASE in the last 168 hours. No results for input(s): AMMONIA in the last 168 hours. Coagulation Profile: Recent Labs  Lab 08/19/17 0044  INR 1.06   Cardiac Enzymes: No results for input(s): CKTOTAL, CKMB, CKMBINDEX, TROPONINI in the last 168 hours. BNP (last 3 results) No results for input(s): PROBNP in the last 8760 hours. HbA1C: No results for input(s): HGBA1C  in the last 72 hours. CBG: No results for input(s): GLUCAP in the last 168 hours. Lipid Profile: No results for input(s): CHOL, HDL, LDLCALC, TRIG, CHOLHDL, LDLDIRECT in the last 72 hours. Thyroid Function Tests: No results for input(s): TSH, T4TOTAL, FREET4, T3FREE, THYROIDAB in the last 72 hours. Anemia Panel: No results for input(s): VITAMINB12, FOLATE, FERRITIN, TIBC, IRON, RETICCTPCT in the last 72 hours. Urine analysis:    Component Value Date/Time   COLORURINE YELLOW 08/19/2017 0046   APPEARANCEUR CLEAR 08/19/2017 0046   LABSPEC 1.016 08/19/2017 0046   PHURINE 6.0 08/19/2017 0046   GLUCOSEU NEGATIVE 08/19/2017 0046   HGBUR NEGATIVE 08/19/2017 0046   BILIRUBINUR NEGATIVE 08/19/2017 0046   KETONESUR NEGATIVE 08/19/2017 0046   PROTEINUR NEGATIVE 08/19/2017 0046   NITRITE NEGATIVE 08/19/2017 0046   LEUKOCYTESUR NEGATIVE 08/19/2017 0046   Sepsis Labs: _2 (procalcitonin:4,lacticidven:4) )No results found for this or any previous visit (from the past 240 hour(s)).   Radiological Exams on Admission: Dg Chest 2 View  Result Date:  08/18/2017 CLINICAL DATA:  Leg swelling EXAM: CHEST  2 VIEW COMPARISON:  None. FINDINGS: The heart size and mediastinal contours are within normal limits. Both lungs are clear. Degenerative osteophytes of the spine. IMPRESSION: No active cardiopulmonary disease. Electronically Signed   By: Donavan Foil M.D.   On: 08/18/2017 21:03   Ct Abdomen Pelvis W Contrast  Result Date: 08/18/2017 CLINICAL DATA:  Leg swelling EXAM: CT ABDOMEN AND PELVIS WITH CONTRAST TECHNIQUE: Multidetector CT imaging of the abdomen and pelvis was performed using the standard protocol following bolus administration of intravenous contrast. CONTRAST:  171m ISOVUE-300 IOPAMIDOL (ISOVUE-300) INJECTION 61% COMPARISON:  None. FINDINGS: Lower chest: Lung bases demonstrate no acute consolidation or pleural effusion. Normal heart size. Hepatobiliary: No focal liver abnormality is  seen. No gallstones, gallbladder wall thickening, or biliary dilatation. Pancreas: Unremarkable. No pancreatic ductal dilatation or surrounding inflammatory changes. Spleen: Normal in size without focal abnormality. Adrenals/Urinary Tract: Adrenal glands are within normal limits. Severe bilateral hydronephrosis and hydroureter, right greater than left. Massive over distention of the bladder which extends into the upper abdomen. There is displacement of the bowel to the periphery by the markedly enlarged bladder. Stomach/Bowel: Stomach within normal limits. No dilated small bowel. No colon wall thickening. Vascular/Lymphatic: Nonaneurysmal aorta. Atherosclerosis. No significantly enlarged lymph nodes Reproductive: Prostate enlargement. Somewhat infiltrative appearing soft tissue density at the base of the bladder, appears contiguous with prostate gland. Other: Negative for free air or ascites. Diffuse subcutaneous edema within the pelvis and proximal thighs. Musculoskeletal: No acute or significant osseous findings. IMPRESSION: 1. Massive distension of the urinary bladder, resulting in severe bilateral hydronephrosis and hydroureter. Findings would be concerning for outlet obstruction. Somewhat infiltrating appearing mass or soft tissue density at the bladder base which appears contiguous with the prostate gland for which clinical correlation is recommended. 2. There are no other acute abnormalities visualized. Electronically Signed   By: KDonavan FoilM.D.   On: 08/18/2017 23:33     EKG:  will get one.   Assessment/Plan Principal Problem:   Hydronephrosis due to obstruction of bladder Active Problems:   Bilateral leg edema   BPH (benign prostatic hyperplasia)   Elevated blood pressure reading   Urinary retention   Hydronephrosis due to obstruction of bladder: CT scan showed massive distension of the urinary bladder, resulting in severe bilateral hydronephrosis and hydroureter. Findings would be  concerning for outlet obstruction. Somewhat infiltrating appearing mass or soft tissue density at the bladder base which appears contiguous with the prostate gland. After Foley catheter is placed, more than 2 L urine was removed. Urology, Dr. WJeffie Pollockwas consulted.  -will admit to med-surg bed as inpt -Follow-up urologist recommendation -INR/PTT/type & screen -keep pt NPO -IVF: 100 cc/h of NS -check PSA -start flomax -monitoring post obstructive diuresis  Hx of BPH (benign prostatic hyperplasia): -start Flomax  Bilateral leg edema: R>L. etiology is not clear. Patient has erythema and mild warmth, but no any tenderness. Patient does not have fever or leukocytosis. Clinically does not seem to have infection, though cannot completely rule out cellulitis. Since diagnosis is not clear. I discussed with patient, he would like to hold off antibiotics. -Lower extremity Doppler to rule out DVT -Check ESR, CRP, BNP -Get blood culture  Elevated blood pressure reading: bp 171/92. No hx of HTN. -prn hydralazine Urinary retention: -Foley placed  DVT ppx: SCD Code Status: Full code Family Communication: None at bed side. Disposition Plan:  Anticipate discharge back to previous home environment Consults called:  Urology, Dr.  Wrenn Admission status:   medical floor/inpt  Date of Service 08/19/2017    Ivor Costa [facility] Pager [phone]  If 7PM-7AM, please contact night-coverage [email or web address] Password [facility] 08/19/2017, 5:43 AM

## 2017-08-20 ENCOUNTER — Inpatient Hospital Stay (HOSPITAL_COMMUNITY): Payer: Federal, State, Local not specified - PPO

## 2017-08-20 DIAGNOSIS — R3915 Urgency of urination: Secondary | ICD-10-CM | POA: Diagnosis not present

## 2017-08-20 DIAGNOSIS — R030 Elevated blood-pressure reading, without diagnosis of hypertension: Secondary | ICD-10-CM | POA: Diagnosis not present

## 2017-08-20 DIAGNOSIS — R339 Retention of urine, unspecified: Secondary | ICD-10-CM | POA: Diagnosis not present

## 2017-08-20 DIAGNOSIS — N320 Bladder-neck obstruction: Secondary | ICD-10-CM | POA: Diagnosis not present

## 2017-08-20 DIAGNOSIS — N3289 Other specified disorders of bladder: Secondary | ICD-10-CM | POA: Diagnosis not present

## 2017-08-20 DIAGNOSIS — N401 Enlarged prostate with lower urinary tract symptoms: Secondary | ICD-10-CM | POA: Diagnosis not present

## 2017-08-20 DIAGNOSIS — N138 Other obstructive and reflux uropathy: Secondary | ICD-10-CM | POA: Diagnosis not present

## 2017-08-20 DIAGNOSIS — R39198 Other difficulties with micturition: Secondary | ICD-10-CM | POA: Diagnosis not present

## 2017-08-20 DIAGNOSIS — Z79899 Other long term (current) drug therapy: Secondary | ICD-10-CM | POA: Diagnosis not present

## 2017-08-20 DIAGNOSIS — R600 Localized edema: Secondary | ICD-10-CM

## 2017-08-20 DIAGNOSIS — N134 Hydroureter: Secondary | ICD-10-CM | POA: Diagnosis not present

## 2017-08-20 DIAGNOSIS — N1330 Unspecified hydronephrosis: Secondary | ICD-10-CM | POA: Diagnosis not present

## 2017-08-20 DIAGNOSIS — I351 Nonrheumatic aortic (valve) insufficiency: Secondary | ICD-10-CM | POA: Diagnosis not present

## 2017-08-20 DIAGNOSIS — R300 Dysuria: Secondary | ICD-10-CM | POA: Diagnosis not present

## 2017-08-20 DIAGNOSIS — K219 Gastro-esophageal reflux disease without esophagitis: Secondary | ICD-10-CM | POA: Diagnosis not present

## 2017-08-20 DIAGNOSIS — Z91040 Latex allergy status: Secondary | ICD-10-CM | POA: Diagnosis not present

## 2017-08-20 LAB — URINE CULTURE: Culture: NO GROWTH

## 2017-08-20 LAB — PSA, TOTAL AND FREE
PROSTATE SPECIFIC AG, SERUM: 2.7 ng/mL (ref 0.0–4.0)
PSA FREE PCT: 18.1 %
PSA, Free: 0.49 ng/mL

## 2017-08-20 LAB — GLUCOSE, CAPILLARY
GLUCOSE-CAPILLARY: 107 mg/dL — AB (ref 65–99)
GLUCOSE-CAPILLARY: 101 mg/dL — AB (ref 65–99)

## 2017-08-20 MED ORDER — TAMSULOSIN HCL 0.4 MG PO CAPS: 0.4000 mg | ORAL_CAPSULE | Freq: Every day | ORAL | 1 refills | Status: DC

## 2017-08-20 NOTE — Progress Notes (Addendum)
Patient ID: Stanley Schneider, male   DOB: 06/22/1951, 66 y.o.   MRN: [id number]    Subjective: CC: Voiding difficulty.  Hx:  Stanley Schneider is doing well with the foley catheter and the urine is clear.  He has a history progressive voiding difficulty and a massively distended bladder with bilateral hydro and a right bladder base/prostate mass.  His PSA was 3.25.  The urine cytology is pending.  The venous doppler report is pending.   He has no hematuria or flank pain and no associated signs or symptoms. ROS:  Review of Systems  All other systems reviewed and are negative.   Anti-infectives: Anti-infectives (From admission, onward)   None      Current Facility-Administered Medications  Medication Dose Route Frequency Provider Last Rate Last Dose  . acetaminophen (TYLENOL) tablet 650 mg  650 mg Oral Q6H PRN Ivor Costa, MD       Or  . acetaminophen (TYLENOL) suppository 650 mg  650 mg Rectal Q6H PRN Ivor Costa, MD      . famotidine (PEPCID) tablet 20 mg  20 mg Oral BID Ivor Costa, MD   20 mg at 08/19/17 2058  . heparin injection 5,000 Units  5,000 Units Subcutaneous Cleophas Dunker, MD   5,000 Units at 08/20/17 6063  . hydrALAZINE (APRESOLINE) injection 5 mg  5 mg Intravenous Q2H PRN Ivor Costa, MD      . multivitamin with minerals tablet 1 tablet  1 tablet Oral Daily Ivor Costa, MD   1 tablet at 08/19/17 1045  . ondansetron (ZOFRAN) tablet 4 mg  4 mg Oral Q6H PRN Ivor Costa, MD       Or  . ondansetron [facility]) injection 4 mg  4 mg Intravenous Q6H PRN Ivor Costa, MD      . tamsulosin (FLOMAX) capsule 0.4 mg  0.4 mg Oral QPC supper Ivor Costa, MD   0.4 mg at 08/19/17 1811  . zolpidem (AMBIEN) tablet 5 mg  5 mg Oral QHS PRN Ivor Costa, MD         Objective: Vital signs in last 24 hours: Temp:  [98.7 F (37.1 C)-99 F (37.2 C)] 99 F (37.2 C) (12/01 2040) Pulse Rate:  [73-74] 73 (12/01 2040) Resp:  [16-18] 16 (12/01 2040) BP: (130-136)/(51-58) 130/58 (12/01 2040) SpO2:  [97 %-98 %] 97 %  (12/01 2040)  Intake/Output from previous day: 12/01 0701 - 12/02 0700 In: 300 [P.O.:300] Out: 1650 [Urine:1650] Intake/Output this shift: Total I/O In: -  Out: 150 [Urine:150]   Physical Exam  Constitutional: He is well-developed, well-nourished, and in no distress.  Genitourinary:  Genitourinary Comments: Urine clear in foley bag.   Vitals reviewed.   Lab Results:  Recent Labs    08/18/17 2035 08/19/17 0459  WBC 5.8 5.2  HGB 11.3* 10.7*  HCT 34.3* 32.7*  PLT 246 209   BMET Recent Labs    08/18/17 2035 08/19/17 0459  NA 138 138  K 4.3 3.8  CL 108 109  CO2 22 22  GLUCOSE 89 90  BUN 28* 25*  CREATININE 1.10 1.09  CALCIUM 8.8* 8.4*   PT/INR Recent Labs    08/19/17 0044  LABPROT 13.7  INR 1.06   ABG No results for input(s): PHART, HCO3 in the last 72 hours.  Invalid input(s): PCO2, PO2  Studies/Results: Dg Chest 2 View  Result Date: 08/18/2017 CLINICAL DATA:  Leg swelling EXAM: CHEST  2 VIEW COMPARISON:  None. FINDINGS: The heart size and mediastinal contours are within  normal limits. Both lungs are clear. Degenerative osteophytes of the spine. IMPRESSION: No active cardiopulmonary disease. Electronically Signed   By: Donavan Foil M.D.   On: 08/18/2017 21:03   Ct Abdomen Pelvis W Contrast  Result Date: 08/18/2017 CLINICAL DATA:  Leg swelling EXAM: CT ABDOMEN AND PELVIS WITH CONTRAST TECHNIQUE: Multidetector CT imaging of the abdomen and pelvis was performed using the standard protocol following bolus administration of intravenous contrast. CONTRAST:  147mL ISOVUE-300 IOPAMIDOL (ISOVUE-300) INJECTION 61% COMPARISON:  None. FINDINGS: Lower chest: Lung bases demonstrate no acute consolidation or pleural effusion. Normal heart size. Hepatobiliary: No focal liver abnormality is seen. No gallstones, gallbladder wall thickening, or biliary dilatation. Pancreas: Unremarkable. No pancreatic ductal dilatation or surrounding inflammatory changes. Spleen: Normal in  size without focal abnormality. Adrenals/Urinary Tract: Adrenal glands are within normal limits. Severe bilateral hydronephrosis and hydroureter, right greater than left. Massive over distention of the bladder which extends into the upper abdomen. There is displacement of the bowel to the periphery by the markedly enlarged bladder. Stomach/Bowel: Stomach within normal limits. No dilated small bowel. No colon wall thickening. Vascular/Lymphatic: Nonaneurysmal aorta. Atherosclerosis. No significantly enlarged lymph nodes Reproductive: Prostate enlargement. Somewhat infiltrative appearing soft tissue density at the base of the bladder, appears contiguous with prostate gland. Other: Negative for free air or ascites. Diffuse subcutaneous edema within the pelvis and proximal thighs. Musculoskeletal: No acute or significant osseous findings. IMPRESSION: 1. Massive distension of the urinary bladder, resulting in severe bilateral hydronephrosis and hydroureter. Findings would be concerning for outlet obstruction. Somewhat infiltrating appearing mass or soft tissue density at the bladder base which appears contiguous with the prostate gland for which clinical correlation is recommended. 2. There are no other acute abnormalities visualized. Electronically Signed   By: Donavan Foil M.D.   On: 08/18/2017 23:33   I have reviewed his PSA.   Assessment and Plan:  1.  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  The hydro could be secondary to the distended bladder but the right is more severe and could be secondary to the mass.   He has normal renal function.   A renal US has been ordered for today.   His PSA is normal.   I am working on  Scheduling a cystoscopy with TUR of prostate/bladder mass and the prostatic urethra to relieve obstruction.   The PSA is only minimally elevated and I don't think I will need to do a transrectal ultrasound guided  prostate biopsy since the bulk of the mass is at the bladder neck and his exam was benign. .   If he has persistent hydronephrosis on the [place] today,  I will do retrogrades and possible ureteral stenting.   I have again reviewed the risks of the procedure including bleeding, infection, urethral and bladder injury with possible stricture formation, need for a stent or secondary procedures, incontinence, ejaculatory and erectile dysfunction, thrombotic events and anesthetic complications.   I explained to him that he will probably need a foley for 2 or more months for the bladder to recover from the degree of over distention.   2.  Bilateral lower extremity edema, left > right.    He has had venous dopplers and the report is still pending.   If the has a DVT, that will complicate the plans above and he may need a Greenfield filter so that we can proceed with the biopsies and resection.   If the dopplers are negative, he  could be discharged from a GU standpoint after the renal US is done.       12:45p  Renal US shows decompression of the left kidney and mild residual dilation on the right.    Venous dopplers are negative for DVT.      LOS: 1 day    Irine Seal 08/20/2017 [phone]

## 2017-08-20 NOTE — Discharge Instructions (Signed)
Indwelling Urinary Catheter Care, Adult  Taking good care of your catheter will keep it working properly and help prevent problems from developing.  How to wear your catheter  Attach your catheter to your leg with adhesive tape or a leg strap. Make sure there is no tension on the catheter. If you use adhesive tape, first remove any sticky residue from the previous tape you used.  How to wear a drainage bag  · You should have received a large overnight drainage bag and a smaller leg bag that fits underneath clothing. You may wear the overnight bag at any time, but you should never wear the smaller leg bag at night.  · Always keep the overnight drainage bag below the level of your bladder, but keep it off the floor. When you sleep, hang the bag inside a wastebasket that is covered by a clean plastic bag.  · Always wear the leg bag below your knee. Keep the leg bag secure with a leg strap or adhesive tape.  How to care for your skin  · Clean the skin around the catheter at least once every day.  · Shower every day. Do not take baths.  · Apply creams, lotions, or ointments to your genital area only as told by your health care provider.  · Do not use powders, sprays, or lotions on your genital area.  How to clean your catheter and your skin  1. Wash your hands with soap and water.  2. Wet a washcloth in warm water and mild soap.  3. Use the washcloth to clean the skin where the catheter enters your body. Clean downward, wiping away from the catheter in small circles. Do not wipe toward the catheter. This can push bacteria into the urethra and cause infection.  4. Pat the area dry with a clean towel. Make sure to remove all soap.  How to care for your drainage bags  Empty your drainage bag when it is ?–½ full, or at least 2–3 times a day. Replace your drainage bag once a month or sooner if it starts to smell bad or look dirty. Do not clean your drainage bag unless told by your health care provider.   Emptying a drainage bag    Supplies Needed  · Rubbing alcohol.  · Gauze pad or cotton ball.  · Adhesive tape or a leg strap.    Steps  1. Wash your hands with soap and water.  2. Detach the drainage bag from your leg.  3. Hold the drainage bag over the toilet or a clean container. Keep the drainage bag below your hips and bladder. This stops urine from going back into the tubing and into your bladder.  4. Open the pour spout at the bottom of the bag.  5. Empty the urine into the toilet or container. Do not let the pour spout touch any surface. This helps keep bacteria out of the bag and helps prevent infection.  6. Apply rubbing alcohol to a gauze pad or cotton ball.  7. Use the gauze pad or cotton ball to clean the pour spout.  8. Close the pour spout.  9. Attach the bag to your leg with adhesive tape or a leg strap.  10. Wash your hands.    Changing a drainage bag  Supplies Needed  · Alcohol wipes.  · A clean drainage bag.  · Adhesive tape or a leg strap.    Steps  1. Wash your hands with soap and water.    2. Detach the dirty drainage bag from your leg.  3. Pinch the rubber catheter with your fingers so that urine does not spill out.  4. Disconnect the catheter tube from the drainage tube at the connection valve. Do not let the tubes touch any surface.  5. Clean the end of the catheter tube with an alcohol wipe. Use a different alcohol wipe to clean the end of the drainage tube.  6. Connect the catheter tube to the drainage tube of the clean drainage bag.  7. Attach the new bag to your leg with adhesive tape or a leg strap. Avoid attaching the new bag too tightly.  8. Wash your hands.    How to prevent infection and other problems  · Never pull on your catheter or try to remove it. Pulling can damage your internal tissues.  · Always wash your hands before and after handling your catheter.  · If a leg strap gets wet, replace it with a dry one.   · Drink enough fluids to keep your urine clear or pale yellow, or as told by your health care provider.  · Do not let the drainage bag or tubing touch the floor.  · Wear cotton underwear. Cotton absorbs moisture and keeps your skin dry.  · If you are male, wipe from front to back after each bowel movement.  · Check the catheter often to make sure that it works properly and the tubing is not twisted or curled.  Contact a health care provider if:  · Your urine is cloudy.  · Your urine smells unusually bad.  · Your urine is not draining into the bag.  · Your catheter gets clogged.  · Your catheter starts to leak.  · Your bladder feels full.  Get help right away if:  · You have redness, swelling, or pain where the catheter enters your body.  · You have fluid, pus, or a bad smell coming from the area where the catheter enters your body.  · The area where the catheter enters your body feels warm to the touch.  · You have a fever.  · You have pain in your abdomen, legs, lower back, or bladder.  · You see blood fill the catheter.  · Your urine is pink or red.  · You have nausea, vomiting, or chills.  · Your catheter gets pulled out.  This information is not intended to replace advice given to you by your health care provider. Make sure you discuss any questions you have with your health care provider.  Document Released: 09/05/2005 Document Revised: 08/03/2016 Document Reviewed: 02/18/2014  Elsevier Interactive Patient Education © 2018 [employer]

## 2017-08-20 NOTE — H&P (View-Only) (Signed)
Patient ID: Stanley Schneider, male   DOB: [birth year], 66 y.o.   MRN: [id number]    Subjective: CC: Voiding difficulty.  Hx:  Mr. Drees is doing well with the foley catheter and the urine is clear.  He has a history progressive voiding difficulty and a massively distended bladder with bilateral hydro and a right bladder base/prostate mass.  His PSA was 3.25.  The urine cytology is pending.  The venous doppler report is pending.   He has no hematuria or flank pain and no associated signs or symptoms. ROS:  Review of Systems  All other systems reviewed and are negative.   Anti-infectives: Anti-infectives (From admission, onward)   None      Current Facility-Administered Medications  Medication Dose Route Frequency Provider Last Rate Last Dose  . acetaminophen (TYLENOL) tablet 650 mg  650 mg Oral Q6H PRN Ivor Costa, MD       Or  . acetaminophen (TYLENOL) suppository 650 mg  650 mg Rectal Q6H PRN Ivor Costa, MD      . famotidine (PEPCID) tablet 20 mg  20 mg Oral BID Ivor Costa, MD   20 mg at 08/19/17 2058  . heparin injection 5,000 Units  5,000 Units Subcutaneous Cleophas Dunker, MD   5,000 Units at 08/20/17 2993  . hydrALAZINE (APRESOLINE) injection 5 mg  5 mg Intravenous Q2H PRN Ivor Costa, MD      . multivitamin with minerals tablet 1 tablet  1 tablet Oral Daily Ivor Costa, MD   1 tablet at 08/19/17 1045  . ondansetron (ZOFRAN) tablet 4 mg  4 mg Oral Q6H PRN Ivor Costa, MD       Or  . ondansetron [facility]) injection 4 mg  4 mg Intravenous Q6H PRN Ivor Costa, MD      . tamsulosin (FLOMAX) capsule 0.4 mg  0.4 mg Oral QPC supper Ivor Costa, MD   0.4 mg at 08/19/17 1811  . zolpidem (AMBIEN) tablet 5 mg  5 mg Oral QHS PRN Ivor Costa, MD         Objective: Vital signs in last 24 hours: Temp:  [98.7 F (37.1 C)-99 F (37.2 C)] 99 F (37.2 C) (12/01 2040) Pulse Rate:  [73-74] 73 (12/01 2040) Resp:  [16-18] 16 (12/01 2040) BP: (130-136)/(51-58) 130/58 (12/01 2040) SpO2:  [97 %-98 %] 97 %  (12/01 2040)  Intake/Output from previous day: 12/01 0701 - 12/02 0700 In: 300 [P.O.:300] Out: 1650 [Urine:1650] Intake/Output this shift: Total I/O In: -  Out: 150 [Urine:150]   Physical Exam  Constitutional: He is well-developed, well-nourished, and in no distress.  Genitourinary:  Genitourinary Comments: Urine clear in foley bag.   Vitals reviewed.   Lab Results:  Recent Labs    08/18/17 2035 08/19/17 0459  WBC 5.8 5.2  HGB 11.3* 10.7*  HCT 34.3* 32.7*  PLT 246 209   BMET Recent Labs    08/18/17 2035 08/19/17 0459  NA 138 138  K 4.3 3.8  CL 108 109  CO2 22 22  GLUCOSE 89 90  BUN 28* 25*  CREATININE 1.10 1.09  CALCIUM 8.8* 8.4*   PT/INR Recent Labs    08/19/17 0044  LABPROT 13.7  INR 1.06   ABG No results for input(s): PHART, HCO3 in the last 72 hours.  Invalid input(s): PCO2, PO2  Studies/Results: Dg Chest 2 View  Result Date: 08/18/2017 CLINICAL DATA:  Leg swelling EXAM: CHEST  2 VIEW COMPARISON:  None. FINDINGS: The heart size and mediastinal contours are within  normal limits. Both lungs are clear. Degenerative osteophytes of the spine. IMPRESSION: No active cardiopulmonary disease. Electronically Signed   By: Donavan Foil M.D.   On: 08/18/2017 21:03   Ct Abdomen Pelvis W Contrast  Result Date: 08/18/2017 CLINICAL DATA:  Leg swelling EXAM: CT ABDOMEN AND PELVIS WITH CONTRAST TECHNIQUE: Multidetector CT imaging of the abdomen and pelvis was performed using the standard protocol following bolus administration of intravenous contrast. CONTRAST:  123mL ISOVUE-300 IOPAMIDOL (ISOVUE-300) INJECTION 61% COMPARISON:  None. FINDINGS: Lower chest: Lung bases demonstrate no acute consolidation or pleural effusion. Normal heart size. Hepatobiliary: No focal liver abnormality is seen. No gallstones, gallbladder wall thickening, or biliary dilatation. Pancreas: Unremarkable. No pancreatic ductal dilatation or surrounding inflammatory changes. Spleen: Normal in  size without focal abnormality. Adrenals/Urinary Tract: Adrenal glands are within normal limits. Severe bilateral hydronephrosis and hydroureter, right greater than left. Massive over distention of the bladder which extends into the upper abdomen. There is displacement of the bowel to the periphery by the markedly enlarged bladder. Stomach/Bowel: Stomach within normal limits. No dilated small bowel. No colon wall thickening. Vascular/Lymphatic: Nonaneurysmal aorta. Atherosclerosis. No significantly enlarged lymph nodes Reproductive: Prostate enlargement. Somewhat infiltrative appearing soft tissue density at the base of the bladder, appears contiguous with prostate gland. Other: Negative for free air or ascites. Diffuse subcutaneous edema within the pelvis and proximal thighs. Musculoskeletal: No acute or significant osseous findings. IMPRESSION: 1. Massive distension of the urinary bladder, resulting in severe bilateral hydronephrosis and hydroureter. Findings would be concerning for outlet obstruction. Somewhat infiltrating appearing mass or soft tissue density at the bladder base which appears contiguous with the prostate gland for which clinical correlation is recommended. 2. There are no other acute abnormalities visualized. Electronically Signed   By: Donavan Foil M.D.   On: 08/18/2017 23:33   I have reviewed his PSA.   Assessment and Plan:  1.  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  The hydro could be secondary to the distended bladder but the right is more severe and could be secondary to the mass.   He has normal renal function.   A renal US has been ordered for today.   His PSA is normal.   I am working on  Scheduling a cystoscopy with TUR of prostate/bladder mass and the prostatic urethra to relieve obstruction.   The PSA is only minimally elevated and I don't think I will need to do a transrectal ultrasound guided  prostate biopsy since the bulk of the mass is at the bladder neck and his exam was benign. .   If he has persistent hydronephrosis on the [place] today,  I will do retrogrades and possible ureteral stenting.   I have again reviewed the risks of the procedure including bleeding, infection, urethral and bladder injury with possible stricture formation, need for a stent or secondary procedures, incontinence, ejaculatory and erectile dysfunction, thrombotic events and anesthetic complications.   I explained to him that he will probably need a foley for 2 or more months for the bladder to recover from the degree of over distention.   2.  Bilateral lower extremity edema, left > right.    He has had venous dopplers and the report is still pending.   If the has a DVT, that will complicate the plans above and he may need a Greenfield filter so that we can proceed with the biopsies and resection.   If the dopplers are negative, he  could be discharged from a GU standpoint after the renal US is done.       12:45p  Renal US shows decompression of the left kidney and mild residual dilation on the right.    Venous dopplers are negative for DVT.      LOS: 1 day    Irine Seal 08/20/2017 [phone]

## 2017-08-20 NOTE — Discharge Summary (Signed)
Physician Discharge Summary  Stanley Schneider YWV:[id number] DOB: [birth year] DOA: 08/18/2017  PCP: Patient, No Pcp Per  Admit date: 08/18/2017 Discharge date: 08/20/2017  Time spent: 30 minutes  Recommendations for Outpatient Follow-up:  -Outpatient follow-up with Dr. Jeffie Pollock (urology service) for further treatment and interventions on his obstructive uropathy problem. -Recheck blood pressure and initiate antihypertensive regimen if needed. -Repeat basic metabolic panel to follow electrolytes and renal function.  Discharge Diagnoses:  Principal Problem:   Hydronephrosis due to obstruction of bladder Active Problems:   Bilateral leg edema   BPH (benign prostatic hyperplasia)   Elevated blood pressure reading   Urinary retention   Bilateral hydronephrosis   Discharge Condition: Stable and improved.  Patient has been discharged home with Foley catheter in place and instruction to follow-up with Dr. Jeffie Pollock.  Diet recommendation: Heart healthy diet.  Filed Weights   08/19/17 0149  Weight: 70.4 kg (155 lb 3.3 oz)    History of present illness:  As per HPI written by Dr. Blaine Hamper on 08/19/17 66 y.o. male with medical history significant of BPH, aortic regurgitation, GERD, who presents with bilateral leg edema and difficulty urinating.  Pt states that he started to notice that his left leg was swollen since 08/12/17. Since then, it is gradually gotten more swollen and red. He later noticed mild swelling in the right leg. Patient did not have any injury. No any tenderness in both legs. No recent long distant traveling. Patient does not have fever or chills. Patient also reports difficulty urinating recently. He has dysuria, and urinary urgency, but denies burning on urination. He has decreased urine output. Patient denies chest pain, shortness breath, no GI symptoms. No unilateral weakness.   Hospital Course:  1-bilateral hydronephrosis secondary to obstruction of bladder: -Improving/resolved  after decompression with placement of Foley catheter -Patient will follow up with urology service as an outpatient for definitive treatment. -Has been discharged with Foley catheter in place.Marland Kitchen  2-Globus vesicalis: With concerns for malignancy causing the obstruction (either bladder versus prostate in origin). -Bladder has been decompressed with the placement of the Foley catheter -Urology has check PSA and is planning to follow him up as an outpatient for cystoscopy, biopsy, definitive treatment/ potential TURP -At discharge patient renal ultrasound demonstrated decompressed bladder and he was no having any pain or acute distress. -discharge on flomax  3-elevated blood pressure -No prior history of hypertension -Patient advised to follow a low-sodium diet -No medications provided as at the moment of discharge blood pressure was within normal limits. -Blood pressure most likely elevated in the acute distress/globules vesicalis setting.  4-bilateral leg edema: -No DVT, Baker's cyst or SVTs appreciated on lower extremity duplex. -Patient significant edema improved after his whole Foley and circulation got better with placement of the Foley catheter -Advised to follow a low-sodium diet, to continue using TED hoses (12 hours on and 12 hours off) and to maintain legs elevated as much as possible.  5-GERD -Continue famotidine  Procedures:  See below for x-ray reports  Consultations:  Urology service  Discharge Exam: Vitals:   08/19/17 2040 08/20/17 0617  BP: (!) 130/58 (!) 126/48  Pulse: 73 63  Resp: 16 16  Temp: 99 F (37.2 C) 98 F (36.7 C)  SpO2: 97% 98%    General: Afebrile, no chest pain, no nausea, no vomiting, no abdominal pain.  Patient in no acute distress and looking to go home on follow-up with urology as an outpatient. Cardiovascular: S1 and S2, no rubs, no gallops, soft murmur  appreciated on exam.  No JVD Respiratory: Clear to auscultation bilaterally Abdomen:  Soft, nontender, nondistended, positive bowel sounds. Extremities: trace edema to 1+ all the way to his knees bilaterally.  No erythema, no pain on palpation, no cyanosis or clubbing.  Discharge Instructions   Discharge Instructions    Discharge instructions   Complete by:  As directed    Take medications as prescribed  Follow up with urology service as instructed Keep yourself well hydrated Follow low sodium diet  TED hoses (12 hours on and 12 hours off) Follow Foley care as instructed     Allergies as of 08/20/2017      Reactions   Latex Rash      Medication List    TAKE these medications   famotidine 20 MG tablet Commonly known as:  PEPCID Take 20 mg by mouth 2 (two) times daily.   multivitamin with minerals tablet Take 1 tablet by mouth daily.   tamsulosin 0.4 MG Caps capsule Commonly known as:  FLOMAX Take 1 capsule (0.4 mg total) by mouth daily after supper.      Allergies  Allergen Reactions  . Latex Rash   Follow-up Information    Irine Seal, MD. Call in 1 week(s).   Specialty:  Urology Why:  office will contact you with appointment details; if no calls in 5 days, contact office. Contact information: [address] [address] [phone]            The results of significant diagnostics from this hospitalization (including imaging, microbiology, ancillary and laboratory) are listed below for reference.    Significant Diagnostic Studies: Dg Chest 2 View  Result Date: 08/18/2017 CLINICAL DATA:  Leg swelling EXAM: CHEST  2 VIEW COMPARISON:  None. FINDINGS: The heart size and mediastinal contours are within normal limits. Both lungs are clear. Degenerative osteophytes of the spine. IMPRESSION: No active cardiopulmonary disease. Electronically Signed   By: Donavan Foil M.D.   On: 08/18/2017 21:03   Ct Abdomen Pelvis W Contrast  Result Date: 08/18/2017 CLINICAL DATA:  Leg swelling EXAM: CT ABDOMEN AND PELVIS WITH CONTRAST TECHNIQUE:  Multidetector CT imaging of the abdomen and pelvis was performed using the standard protocol following bolus administration of intravenous contrast. CONTRAST:  169mL ISOVUE-300 IOPAMIDOL (ISOVUE-300) INJECTION 61% COMPARISON:  None. FINDINGS: Lower chest: Lung bases demonstrate no acute consolidation or pleural effusion. Normal heart size. Hepatobiliary: No focal liver abnormality is seen. No gallstones, gallbladder wall thickening, or biliary dilatation. Pancreas: Unremarkable. No pancreatic ductal dilatation or surrounding inflammatory changes. Spleen: Normal in size without focal abnormality. Adrenals/Urinary Tract: Adrenal glands are within normal limits. Severe bilateral hydronephrosis and hydroureter, right greater than left. Massive over distention of the bladder which extends into the upper abdomen. There is displacement of the bowel to the periphery by the markedly enlarged bladder. Stomach/Bowel: Stomach within normal limits. No dilated small bowel. No colon wall thickening. Vascular/Lymphatic: Nonaneurysmal aorta. Atherosclerosis. No significantly enlarged lymph nodes Reproductive: Prostate enlargement. Somewhat infiltrative appearing soft tissue density at the base of the bladder, appears contiguous with prostate gland. Other: Negative for free air or ascites. Diffuse subcutaneous edema within the pelvis and proximal thighs. Musculoskeletal: No acute or significant osseous findings. IMPRESSION: 1. Massive distension of the urinary bladder, resulting in severe bilateral hydronephrosis and hydroureter. Findings would be concerning for outlet obstruction. Somewhat infiltrating appearing mass or soft tissue density at the bladder base which appears contiguous with the prostate gland for which clinical correlation is recommended. 2. There  are no other acute abnormalities visualized. Electronically Signed   By: Donavan Foil M.D.   On: 08/18/2017 23:33   US Renal  Result Date: 08/20/2017 CLINICAL DATA:   Hydronephrosis with bladder distention on recent CT. EXAM: RENAL / URINARY TRACT ULTRASOUND COMPLETE COMPARISON:  CT scan 08/18/2017 FINDINGS: Right Kidney: Length: 10.6 cm. Mild fullness noted intrarenal collecting system although decreased compared to recent CT. Left Kidney: Length: 11.2 cm.  No hydronephrosis. Bladder: Appears completely decompressed with Foley catheter in place. IMPRESSION: Urinary bladder decompressed by a Foley catheter. Resolution of right hydronephrosis seen on recent CT scan with only mild fullness of the right intrarenal collecting system on today's study. No hydronephrosis in the left kidney. Electronically Signed   By: Misty Stanley M.D.   On: 08/20/2017 08:38    Microbiology: Recent Results (from the past 240 hour(s))  Urine Culture     Status: None   Collection Time: 08/19/17 12:46 AM  Result Value Ref Range Status   Specimen Description URINE, CATHETERIZED  Final   Special Requests NONE  Final   Culture   Final    NO GROWTH Performed at [facility], [address] [place],  [address]    Report Status 08/20/2017 FINAL  Final     Labs: Basic Metabolic Panel: Recent Labs  Lab 08/18/17 2035 08/19/17 0459  NA 138 138  K 4.3 3.8  CL 108 109  CO2 22 22  GLUCOSE 89 90  BUN 28* 25*  CREATININE 1.10 1.09  CALCIUM 8.8* 8.4*   Liver Function Tests: No results for input(s): AST, ALT, ALKPHOS, BILITOT, PROT, ALBUMIN in the last 168 hours. No results for input(s): LIPASE, AMYLASE in the last 168 hours. No results for input(s): AMMONIA in the last 168 hours. CBC: Recent Labs  Lab 08/18/17 2035 08/19/17 0459  WBC 5.8 5.2  HGB 11.3* 10.7*  HCT 34.3* 32.7*  MCV 94.2 93.7  PLT 246 209   Cardiac Enzymes: No results for input(s): CKTOTAL, CKMB, CKMBINDEX, TROPONINI in the last 168 hours. BNP: BNP (last 3 results) Recent Labs    08/19/17 0044  BNP 50.4    ProBNP (last 3 results) No results for input(s): PROBNP in the last 8760  hours.  CBG: Recent Labs  Lab 08/19/17 0731 08/20/17 0816 08/20/17 1210  GLUCAP 95 107* 101*       Signed:  Barton Dubois MD.  [facility] 08/20/2017, 12:36 PM

## 2017-08-20 NOTE — Progress Notes (Signed)
Assessment unchanged. Pt verbalized understanding of dc instructions through teach back including follow up care and when to call the doctor. Script x 1 given as provided by MD. Pt demonstrated understanding of how to apply leg bag and empty into commode. Discharged via wc accompanied by NT.

## 2017-08-21 ENCOUNTER — Other Ambulatory Visit: Payer: Self-pay | Admitting: Urology

## 2017-08-21 ENCOUNTER — Encounter (HOSPITAL_COMMUNITY): Payer: Self-pay | Admitting: *Deleted

## 2017-08-21 ENCOUNTER — Other Ambulatory Visit: Payer: Self-pay

## 2017-08-24 ENCOUNTER — Ambulatory Visit (HOSPITAL_COMMUNITY): Payer: Federal, State, Local not specified - PPO

## 2017-08-24 ENCOUNTER — Encounter (HOSPITAL_COMMUNITY): Payer: Self-pay | Admitting: Emergency Medicine

## 2017-08-24 ENCOUNTER — Other Ambulatory Visit: Payer: Self-pay

## 2017-08-24 ENCOUNTER — Ambulatory Visit (HOSPITAL_COMMUNITY): Payer: Federal, State, Local not specified - PPO | Admitting: Anesthesiology

## 2017-08-24 ENCOUNTER — Encounter (HOSPITAL_COMMUNITY)
Admission: RE | Disposition: A | Discharge status: Home / Self Care | Payer: Self-pay | Source: Ambulatory Visit | Attending: Urology

## 2017-08-24 ENCOUNTER — Observation Stay (HOSPITAL_COMMUNITY)
Admission: RE | Admit: 2017-08-24 | Discharge: 2017-08-25 | Disposition: A | Discharge status: Home / Self Care | Payer: Federal, State, Local not specified - PPO | Source: Ambulatory Visit | Attending: Urology | Admitting: Urology

## 2017-08-24 DIAGNOSIS — R600 Localized edema: Secondary | ICD-10-CM | POA: Insufficient documentation

## 2017-08-24 DIAGNOSIS — N401 Enlarged prostate with lower urinary tract symptoms: Secondary | ICD-10-CM | POA: Diagnosis not present

## 2017-08-24 DIAGNOSIS — N3080 Other cystitis without hematuria: Secondary | ICD-10-CM | POA: Diagnosis not present

## 2017-08-24 DIAGNOSIS — N138 Other obstructive and reflux uropathy: Secondary | ICD-10-CM | POA: Insufficient documentation

## 2017-08-24 DIAGNOSIS — N320 Bladder-neck obstruction: Secondary | ICD-10-CM | POA: Insufficient documentation

## 2017-08-24 DIAGNOSIS — N1330 Unspecified hydronephrosis: Secondary | ICD-10-CM | POA: Diagnosis not present

## 2017-08-24 DIAGNOSIS — Z91040 Latex allergy status: Secondary | ICD-10-CM | POA: Insufficient documentation

## 2017-08-24 DIAGNOSIS — Z79899 Other long term (current) drug therapy: Secondary | ICD-10-CM | POA: Diagnosis not present

## 2017-08-24 DIAGNOSIS — N329 Bladder disorder, unspecified: Secondary | ICD-10-CM | POA: Diagnosis not present

## 2017-08-24 DIAGNOSIS — D494 Neoplasm of unspecified behavior of bladder: Secondary | ICD-10-CM | POA: Diagnosis not present

## 2017-08-24 DIAGNOSIS — C61 Malignant neoplasm of prostate: Principal | ICD-10-CM | POA: Insufficient documentation

## 2017-08-24 DIAGNOSIS — R338 Other retention of urine: Secondary | ICD-10-CM | POA: Diagnosis not present

## 2017-08-24 HISTORY — PX: CYSTOSCOPY W/ URETERAL STENT PLACEMENT: SHX1429

## 2017-08-24 HISTORY — PX: TRANSURETHRAL RESECTION OF BLADDER TUMOR: SHX2575

## 2017-08-24 HISTORY — PX: TRANSURETHRAL RESECTION OF PROSTATE: SHX73

## 2017-08-24 LAB — CULTURE, BLOOD (ROUTINE X 2)
CULTURE: NO GROWTH
Culture: NO GROWTH
SPECIAL REQUESTS: ADEQUATE
Special Requests: ADEQUATE

## 2017-08-24 SURGERY — CYSTOSCOPY, WITH RETROGRADE PYELOGRAM AND URETERAL STENT INSERTION
Anesthesia: General | Site: Ureter

## 2017-08-24 MED ORDER — MIDAZOLAM HCL 2 MG/2ML IJ SOLN
0.5000 mg | INTRAMUSCULAR | Status: AC | PRN
  Administered 2017-08-24: 0.5 mg via INTRAVENOUS

## 2017-08-24 MED ORDER — SODIUM CHLORIDE 0.9 % IR SOLN
Status: DC | PRN
  Administered 2017-08-24: 27000 mL via INTRAVESICAL

## 2017-08-24 MED ORDER — OXYBUTYNIN CHLORIDE 5 MG PO TABS: 5.0000 mg | ORAL_TABLET | Freq: Three times a day (TID) | ORAL | Status: DC | PRN

## 2017-08-24 MED ORDER — SUCCINYLCHOLINE CHLORIDE 200 MG/10ML IV SOSY
PREFILLED_SYRINGE | INTRAVENOUS | Status: DC | PRN
  Administered 2017-08-24: 100 mg via INTRAVENOUS

## 2017-08-24 MED ORDER — ONDANSETRON HCL 4 MG/2ML IJ SOLN
INTRAMUSCULAR | Status: AC
  Filled 2017-08-24: qty 2

## 2017-08-24 MED ORDER — HYDROMORPHONE HCL 1 MG/ML IJ SOLN: 0.2500 mg | INTRAMUSCULAR | Status: DC | PRN

## 2017-08-24 MED ORDER — LIDOCAINE 2% (20 MG/ML) 5 ML SYRINGE
INTRAMUSCULAR | Status: AC
  Filled 2017-08-24: qty 5

## 2017-08-24 MED ORDER — DOCUSATE SODIUM 100 MG PO CAPS: 100.0000 mg | ORAL_CAPSULE | Freq: Two times a day (BID) | ORAL | 2 refills | Status: AC

## 2017-08-24 MED ORDER — MIDAZOLAM HCL 5 MG/5ML IJ SOLN
INTRAMUSCULAR | Status: DC | PRN
  Administered 2017-08-24: 2 mg via INTRAVENOUS

## 2017-08-24 MED ORDER — SUGAMMADEX SODIUM 200 MG/2ML IV SOLN
INTRAVENOUS | Status: AC
  Filled 2017-08-24: qty 2

## 2017-08-24 MED ORDER — SODIUM CHLORIDE 0.9 % IV SOLN
INTRAVENOUS | Status: DC | PRN
  Administered 2017-08-24: 50 mL

## 2017-08-24 MED ORDER — SUGAMMADEX SODIUM 200 MG/2ML IV SOLN
INTRAVENOUS | Status: DC | PRN
  Administered 2017-08-24: 200 mg via INTRAVENOUS

## 2017-08-24 MED ORDER — DEXAMETHASONE SODIUM PHOSPHATE 10 MG/ML IJ SOLN
INTRAMUSCULAR | Status: DC | PRN
  Administered 2017-08-24: 10 mg via INTRAVENOUS

## 2017-08-24 MED ORDER — STERILE WATER FOR IRRIGATION IR SOLN
Status: DC | PRN
  Administered 2017-08-24: 500 mL

## 2017-08-24 MED ORDER — FLEET ENEMA 7-19 GM/118ML RE ENEM: 1.0000 | ENEMA | Freq: Once | RECTAL | Status: DC | PRN

## 2017-08-24 MED ORDER — PNEUMOCOCCAL VAC POLYVALENT 25 MCG/0.5ML IJ INJ
0.5000 mL | INJECTION | INTRAMUSCULAR | Status: DC
  Filled 2017-08-24: qty 0.5

## 2017-08-24 MED ORDER — ZOLPIDEM TARTRATE 5 MG PO TABS: 5.0000 mg | ORAL_TABLET | Freq: Every evening | ORAL | Status: DC | PRN

## 2017-08-24 MED ORDER — CEFAZOLIN SODIUM-DEXTROSE 2-4 GM/100ML-% IV SOLN
2.0000 g | INTRAVENOUS | Status: AC
  Administered 2017-08-24: 2 g via INTRAVENOUS
  Filled 2017-08-24: qty 100

## 2017-08-24 MED ORDER — ONDANSETRON HCL 4 MG/2ML IJ SOLN: 4.0000 mg | INTRAMUSCULAR | Status: DC | PRN

## 2017-08-24 MED ORDER — BISACODYL 10 MG RE SUPP: 10.0000 mg | Freq: Every day | RECTAL | Status: DC | PRN

## 2017-08-24 MED ORDER — CIPROFLOXACIN HCL 500 MG PO TABS: 500.0000 mg | ORAL_TABLET | Freq: Two times a day (BID) | ORAL | 0 refills | Status: AC

## 2017-08-24 MED ORDER — LIDOCAINE 2% (20 MG/ML) 5 ML SYRINGE
INTRAMUSCULAR | Status: DC | PRN
  Administered 2017-08-24: 100 mg via INTRAVENOUS

## 2017-08-24 MED ORDER — POTASSIUM CHLORIDE IN NACL 20-0.45 MEQ/L-% IV SOLN
INTRAVENOUS | Status: DC
  Administered 2017-08-24 – 2017-08-25 (×2): via INTRAVENOUS
  Filled 2017-08-24 (×3): qty 1000

## 2017-08-24 MED ORDER — MIDAZOLAM HCL 2 MG/2ML IJ SOLN
INTRAMUSCULAR | Status: AC
  Filled 2017-08-24: qty 2

## 2017-08-24 MED ORDER — PHENYLEPHRINE 40 MCG/ML (10ML) SYRINGE FOR IV PUSH (FOR BLOOD PRESSURE SUPPORT)
PREFILLED_SYRINGE | INTRAVENOUS | Status: DC | PRN
  Administered 2017-08-24: 40 ug via INTRAVENOUS
  Administered 2017-08-24: 80 ug via INTRAVENOUS
  Administered 2017-08-24: 40 ug via INTRAVENOUS

## 2017-08-24 MED ORDER — HYDROMORPHONE HCL 1 MG/ML IJ SOLN: 0.5000 mg | INTRAMUSCULAR | Status: DC | PRN

## 2017-08-24 MED ORDER — FAMOTIDINE 20 MG PO TABS
20.0000 mg | ORAL_TABLET | Freq: Two times a day (BID) | ORAL | Status: DC
  Administered 2017-08-24 – 2017-08-25 (×2): 20 mg via ORAL
  Filled 2017-08-24 (×2): qty 1

## 2017-08-24 MED ORDER — PROPOFOL 10 MG/ML IV BOLUS
INTRAVENOUS | Status: DC | PRN
  Administered 2017-08-24: 110 mg via INTRAVENOUS

## 2017-08-24 MED ORDER — MIDAZOLAM HCL 2 MG/2ML IJ SOLN
INTRAMUSCULAR | Status: AC
Start: 2017-08-24 — End: 2017-08-25
  Filled 2017-08-24: qty 2

## 2017-08-24 MED ORDER — ROCURONIUM BROMIDE 10 MG/ML (PF) SYRINGE
PREFILLED_SYRINGE | INTRAVENOUS | Status: DC | PRN
  Administered 2017-08-24: 10 mg via INTRAVENOUS
  Administered 2017-08-24: 30 mg via INTRAVENOUS

## 2017-08-24 MED ORDER — PROMETHAZINE HCL 25 MG/ML IJ SOLN: 6.2500 mg | INTRAMUSCULAR | Status: DC | PRN

## 2017-08-24 MED ORDER — SUCCINYLCHOLINE CHLORIDE 200 MG/10ML IV SOSY
PREFILLED_SYRINGE | INTRAVENOUS | Status: AC
  Filled 2017-08-24: qty 10

## 2017-08-24 MED ORDER — DIPHENHYDRAMINE HCL 12.5 MG/5ML PO ELIX: 12.5000 mg | ORAL_SOLUTION | Freq: Four times a day (QID) | ORAL | Status: DC | PRN

## 2017-08-24 MED ORDER — ACETAMINOPHEN 325 MG PO TABS
650.0000 mg | ORAL_TABLET | ORAL | Status: DC | PRN
Start: 2017-08-24 — End: 2017-08-25

## 2017-08-24 MED ORDER — LACTATED RINGERS IV SOLN
INTRAVENOUS | Status: DC
  Administered 2017-08-24 (×2): via INTRAVENOUS

## 2017-08-24 MED ORDER — ONDANSETRON HCL 4 MG/2ML IJ SOLN
INTRAMUSCULAR | Status: DC | PRN
  Administered 2017-08-24: 4 mg via INTRAVENOUS

## 2017-08-24 MED ORDER — DEXAMETHASONE SODIUM PHOSPHATE 10 MG/ML IJ SOLN
INTRAMUSCULAR | Status: AC
  Filled 2017-08-24: qty 1

## 2017-08-24 MED ORDER — ROCURONIUM BROMIDE 50 MG/5ML IV SOSY
PREFILLED_SYRINGE | INTRAVENOUS | Status: AC
  Filled 2017-08-24: qty 5

## 2017-08-24 MED ORDER — FENTANYL CITRATE (PF) 100 MCG/2ML IJ SOLN
INTRAMUSCULAR | Status: AC
  Filled 2017-08-24: qty 2

## 2017-08-24 MED ORDER — FENTANYL CITRATE (PF) 100 MCG/2ML IJ SOLN
INTRAMUSCULAR | Status: DC | PRN
  Administered 2017-08-24 (×2): 50 ug via INTRAVENOUS

## 2017-08-24 MED ORDER — OXYCODONE HCL 5 MG PO TABS: 5.0000 mg | ORAL_TABLET | ORAL | Status: DC | PRN

## 2017-08-24 MED ORDER — DIPHENHYDRAMINE HCL 50 MG/ML IJ SOLN: 12.5000 mg | Freq: Four times a day (QID) | INTRAMUSCULAR | Status: DC | PRN

## 2017-08-24 MED ORDER — PROPOFOL 10 MG/ML IV BOLUS
INTRAVENOUS | Status: AC
  Filled 2017-08-24: qty 20

## 2017-08-24 MED ORDER — DOCUSATE SODIUM 100 MG PO CAPS
100.0000 mg | ORAL_CAPSULE | Freq: Two times a day (BID) | ORAL | Status: DC
  Administered 2017-08-24 – 2017-08-25 (×2): 100 mg via ORAL
  Filled 2017-08-24 (×2): qty 1

## 2017-08-24 MED ORDER — MENTHOL 3 MG MT LOZG
1.0000 | LOZENGE | OROMUCOSAL | Status: DC | PRN
  Filled 2017-08-24: qty 9

## 2017-08-24 MED ORDER — SENNOSIDES-DOCUSATE SODIUM 8.6-50 MG PO TABS: 1.0000 | ORAL_TABLET | Freq: Every evening | ORAL | Status: DC | PRN

## 2017-08-24 SURGICAL SUPPLY — 20 items
BAG URINE DRAINAGE (UROLOGICAL SUPPLIES) ×4 IMPLANT
BAG URO CATCHER STRL LF (MISCELLANEOUS) ×4 IMPLANT
CATH FOLEY 3WAY 30CC 22FR (CATHETERS) ×4 IMPLANT
CATH URET 5FR 28IN OPEN ENDED (CATHETERS) ×4 IMPLANT
CLOTH BEACON ORANGE TIMEOUT ST (SAFETY) ×4 IMPLANT
COVER FOOTSWITCH UNIV (MISCELLANEOUS) ×4 IMPLANT
COVER SURGICAL LIGHT HANDLE (MISCELLANEOUS) ×4 IMPLANT
ELECT REM PT RETURN 15FT ADLT (MISCELLANEOUS) ×4 IMPLANT
GLOVE SURG SS PI 8.0 STRL IVOR (GLOVE) IMPLANT
GOWN STRL REUS W/TWL XL LVL3 (GOWN DISPOSABLE) ×4 IMPLANT
GUIDEWIRE STR DUAL SENSOR (WIRE) ×4 IMPLANT
HOLDER FOLEY CATH W/STRAP (MISCELLANEOUS) ×4 IMPLANT
LOOP CUT BIPOLAR 24F LRG (ELECTROSURGICAL) ×4 IMPLANT
MANIFOLD NEPTUNE II (INSTRUMENTS) ×4 IMPLANT
PACK CYSTO (CUSTOM PROCEDURE TRAY) ×4 IMPLANT
SET ASPIRATION TUBING (TUBING) ×4 IMPLANT
SUT ETHILON 3 0 PS 1 (SUTURE) IMPLANT
SYR 30ML LL (SYRINGE) ×4 IMPLANT
SYRINGE IRR TOOMEY STRL 70CC (SYRINGE) ×4 IMPLANT
TUBING CONNECTING 10 (TUBING) ×4 IMPLANT

## 2017-08-24 NOTE — Op Note (Signed)
Procedure: 1.  Cystoscopy with bilateral retrograde pyelograms and interpretation. 2.  Cystoscopy with bladder biopsy and fulguration. 3.  Transurethral resection of the prostate.  Preop diagnosis: BPH with bladder outlet obstruction with urinary retention and bilateral hydronephrosis and possible bladder wall mass.  Postoperative diagnosis: BPH with bladder outlet obstruction and urinary retention with bilateral hydronephrosis and bladder wall edema.  Surgeon: Dr. Irine Seal.  Anesthesia: General.  Specimen: 1.  Bladder washings for cytology. 2.  Posterior bladder biopsy. 3.  Prostate chips.  Drain: 22 [place] Presenter, broadcasting.  EBL: 50 mL.  Complications: None.  Indications: [name] is a 66 year old white male who presented to the emergency room with left leg edema.  During his evaluation he had a CT scan and was found to have a massively dilated bladder with bilateral hydronephrosis.  There was a questionable mass at the left bladder base contiguous with the prostate it was worrisome for neoplasm.  He presents today for cystoscopy with possible TURBT, TURP and retrograde pyelography.  Procedure: [name] was taken to the operating room where general anesthetic was induced.  He was given antibiotics.  He was placed in lithotomy.  He was fitted with PAS hose.  His perineum and genitalia were prepped with Betadine solution and was draped in usual sterile fashion.  Cystoscopy was performed using a 23 [place] scope and 30 and 70 degree lenses.  Cystoscopy demonstrated a normal urethra.  An intact external sphincter.  The prostatic urethra was approximately 4 cm in length with trilobar hyperplasia and obstruction.  There was a off-center middle lobe to the right of midline.  The ureteral orifices were in their normal anatomic position.  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  He had moderate to severe trabeculation.  There was erythema and edema of the bladder wall  with 2 particularly large areas of edema on the posterior wall and dome that appears most consistent with changes from decompression.  Bilateral retrograde pyelography was performed using a 5 French opening catheter and Omnipaque.  Right retrograde pyelogram demonstrated a tortuous ureter with mild residual dilation but no filling defects.  Left retrograde pyelogram demonstrated mild dilation with less tortuosity and no filling defects.  A bladder wash cytology was obtained through the scope.  A cup biopsy was then obtained from the edematous area on the posterior wall.  The cystoscope was removed and was replaced with a 26 French continuous-flow resectoscope sheath that was fitted with an Beatrix Fetters handle with a bipolar loop and the 23 degree lens.  Saline was used as the irrigant.  The biopsy site was fulgurated to provide hemostasis.  He then underwent transurethral resection of the prostate beginning with resection of the middle lobe down to the bladder neck fibers.  The floor of the prostate was then resected out to alongside the verumontanum bilaterally.  The right lateral lobe was then resected from bladder neck to apex out the capsular fibers.  This is followed by the left lateral lobe.  Residual apical and anterior tissue was resected as needed.  The bladder was evacuated free of chips.  And hemostasis was achieved.  Final inspection revealed no retained chips, no active bleeding and intact ureteral orifice ease and external sphincter.  The scope was removed and pressure on the bladder produced an excellent stream.  A 22 French Silastic three-way Foley catheter was inserted with the aid of a catheter guide.  The balloon was filled with 30 cc of sterile fluid.  The catheter was irrigated with clear return and placed to straight drainage and continuous irrigation.  The specimen was retrieved and sent for pathologic analysis.  The patient's anesthetic was reversed and he was taken down  from the lithotomy position and moved to recovery room in stable condition.  There were no complications.

## 2017-08-24 NOTE — Anesthesia Procedure Notes (Signed)
Procedure Name: Intubation Date/Time: 08/24/2017 12:25 PM Performed by: Lind Covert, CRNA Pre-anesthesia Checklist: Patient identified, Emergency Drugs available, Suction available, Patient being monitored and Timeout performed Patient Re-evaluated:Patient Re-evaluated prior to induction Oxygen Delivery Method: Circle system utilized Preoxygenation: Pre-oxygenation with 100% oxygen Induction Type: IV induction Laryngoscope Size: Mac and 4 Grade View: Grade I Tube type: Oral Tube size: 7.5 mm Number of attempts: 1 Airway Equipment and Method: Stylet Placement Confirmation: ETT inserted through vocal cords under direct vision,  positive ETCO2 and breath sounds checked- equal and bilateral Secured at: 22 cm Tube secured with: Tape Dental Injury: Teeth and Oropharynx as per pre-operative assessment

## 2017-08-24 NOTE — Transfer of Care (Signed)
Immediate Anesthesia Transfer of Care Note  Patient: Stanley Schneider  Procedure(s) Performed: CYSTOSCOPY WITH BILATERAL RETROGRADES (Bilateral Ureter) TRANSURETHRAL RESECTION OF BLADDER TUMOR (TURBT) (N/A Bladder) TRANSURETHRAL RESECTION OF THE PROSTATE (TURP) (N/A Prostate)  Patient Location: PACU  Anesthesia Type:General  Level of Consciousness: sedated  Airway & Oxygen Therapy: Patient Spontanous Breathing and Patient connected to face mask oxygen  Post-op Assessment: Report given to RN and Post -op Vital signs reviewed and stable  Post vital signs: Reviewed and stable  Last Vitals:  Vitals:   08/24/17 1010  BP: 136/72  Pulse: (!) 57  Resp: 16  Temp: 36.6 C  SpO2: 100%    Last Pain:  Vitals:   08/24/17 1041  TempSrc:   PainSc: 0-No pain      Patients Stated Pain Goal: 4 (22/48/25 0037)  Complications: No apparent anesthesia complications

## 2017-08-24 NOTE — Progress Notes (Signed)
Patient ID: Stanley Schneider, male   DOB: [birth year], 66 y.o.   MRN: [id number] He is doing well post op without complaints.    Urine pink on brisk CBI.   I reviewed the surgical findings.   I think he just had BPH with BOO but will need path to confirm.  .BP (!) 161/63   Pulse 60   Temp 97.7 F (36.5 C)   Resp 13   Ht 5\' 6"  (1.676 m)   Wt 63.6 kg (140 lb 4 oz)   SpO2 100%   BMI 22.64 kg/m    Plan to D/C with foley in am if urine is clear off of CBI.      Scripts ordered.

## 2017-08-24 NOTE — Discharge Instructions (Signed)
Transurethral Resection of the Prostate, Care After °Refer to this sheet in the next few weeks. These instructions provide you with information about caring for yourself after your procedure. Your health care provider may also give you more specific instructions. Your treatment has been planned according to current medical practices, but problems sometimes occur. Call your health care provider if you have any problems or questions after your procedure. °What can I expect after the procedure? °After the procedure, it is common to have: °· Mild pain in your lower abdomen. °· Soreness or mild discomfort in your penis from having the catheter inserted during the procedure. °· A feeling of urgency when you need to urinate. °· A small amount of blood in your urine. You may notice some small blood clots in your urine. These are normal. ° °Follow these instructions at home: °Medicines ° °· Take over-the-counter and prescription medicines only as told by your health care provider. °· Do not drive or operate heavy machinery while taking prescription pain medicine. °· Do not drive for 24 hours if you received a sedative. °· If you were prescribed antibiotic medicine, take it as told by your health care provider. Do not stop taking the antibiotic even if you start to feel better. °Activity °· Return to your normal activities as told by your health care provider. Ask your health care provider what activities are safe for you. °· Do not lift anything that is heavier than 10 lb (4.5 kg) for 3 weeks after your procedure, or as long as told by your health care provider. °· Avoid intense physical activity for as long as told by your health care provider. °· Walk at least one time every day. This helps to prevent blood clots. You may increase your physical activity gradually as you start to feel better. °Lifestyle °· Do not drink alcohol for as long as told by your health care provider. This is especially important if you are taking  prescription pain medicines. °· Do not engage in sexual activity until your health care provider says that you can do this. °General instructions °· Do not take baths, swim, or use a hot tub until your health care provider approves. °· Drink enough fluid to keep your urine clear or pale yellow. °· Urinate as soon as you feel the need to. Do not try to hold your urine for long periods of time. °· If your health care provider approves, you may take a stool softener for 2-3 weeks to prevent you from straining to have a bowel movement. °· Wear compression stockings as told by your health care provider. These stockings help to prevent blood clots and reduce swelling in your legs. °· Keep all follow-up visits as told by your health care provider. This is important. °Contact a health care provider if: °· You have difficulty urinating. °· You have a fever. °· You have pain that gets worse or does not improve with medicine. °· You have blood in your urine that does not go away after 1 week of resting and drinking more fluids. °· You have swelling in your penis or testicles. °Get help right away if: °· You are unable to urinate. °· You are having more blood clots in your urine instead of fewer. °· You have: °? Large blood clots. °? A lot of blood in your urine. °? Pain in your back or lower abdomen. °? Pain or swelling in your legs. °? Chills and you are shaking. °This information is not intended to   replace advice given to you by your health care provider. Make sure you discuss any questions you have with your health care provider. °Document Released: 09/05/2005 Document Revised: 05/08/2016 Document Reviewed: 05/28/2015 °Elsevier Interactive Patient Education © 2017 [employer] ° °

## 2017-08-24 NOTE — Anesthesia Postprocedure Evaluation (Signed)
Anesthesia Post Note  Patient: Stanley Schneider  Procedure(s) Performed: CYSTOSCOPY WITH BILATERAL RETROGRADES (Bilateral Ureter) TRANSURETHRAL RESECTION OF BLADDER TUMOR (TURBT) (N/A Bladder) TRANSURETHRAL RESECTION OF THE PROSTATE (TURP) (N/A Prostate)     Patient location during evaluation: PACU Anesthesia Type: General Level of consciousness: sedated Pain management: pain level controlled Vital Signs Assessment: post-procedure vital signs reviewed and stable Respiratory status: spontaneous breathing and respiratory function stable Cardiovascular status: stable Postop Assessment: no apparent nausea or vomiting Anesthetic complications: no    Last Vitals:  Vitals:   08/24/17 1500 08/24/17 1515  BP: (!) 156/74 (!) 152/84  Pulse: 60 (!) 57  Resp: 12 14  Temp:    SpO2: 100% 100%    Last Pain:  Vitals:   08/24/17 1500  TempSrc:   PainSc: 4                  Jesiah Yerby DANIEL

## 2017-08-24 NOTE — Anesthesia Preprocedure Evaluation (Addendum)
Anesthesia Evaluation  Patient identified by MRN, date of birth, ID band Patient awake    Reviewed: Allergy & Precautions, NPO status , Patient's Chart, lab work & pertinent test results  History of Anesthesia Complications Negative for: history of anesthetic complications  Airway Mallampati: II  TM Distance: >3 FB Neck ROM: Full    Dental no notable dental hx. (+) Dental Advisory Given   Pulmonary neg pulmonary ROS,    Pulmonary exam normal        Cardiovascular Normal cardiovascular exam+ Valvular Problems/Murmurs AI      Neuro/Psych negative neurological ROS  negative psych ROS   GI/Hepatic negative GI ROS, Neg liver ROS,   Endo/Other  negative endocrine ROS  Renal/GU Renal disease  negative genitourinary   Musculoskeletal negative musculoskeletal ROS (+)   Abdominal   Peds negative pediatric ROS (+)  Hematology negative hematology ROS (+)   Anesthesia Other Findings   Reproductive/Obstetrics negative OB ROS                            Anesthesia Physical Anesthesia Plan  ASA: III  Anesthesia Plan: General   Post-op Pain Management:    Induction: Intravenous  PONV Risk Score and Plan: 2 and Ondansetron and Dexamethasone  Airway Management Planned: Oral ETT  Additional Equipment:   Intra-op Plan:   Post-operative Plan: Extubation in OR  Informed Consent: I have reviewed the patients History and Physical, chart, labs and discussed the procedure including the risks, benefits and alternatives for the proposed anesthesia with the patient or authorized representative who has indicated his/her understanding and acceptance.   Dental advisory given  Plan Discussed with: CRNA, Anesthesiologist and Surgeon  Anesthesia Plan Comments:        Anesthesia Quick Evaluation

## 2017-08-24 NOTE — Interval H&P Note (Signed)
History and Physical Interval Note: He has no new complaints.   08/24/2017 12:05 PM  Stanley Schneider  has presented today for surgery, with the diagnosis of BLADDER/ PROSTATE MASS WITH BILATERAL HYDRONEPHROSIS  The various methods of treatment have been discussed with the patient and family. After consideration of risks, benefits and other options for treatment, the patient has consented to  Procedure(s): CYSTOSCOPY WITH BILATERAL RETROGRADE POSSIBLE URETERAL STENT PLACEMENT (Bilateral) TRANSURETHRAL RESECTION OF BLADDER TUMOR (TURBT) (N/A) TRANSURETHRAL RESECTION OF THE PROSTATE (TURP) (N/A) as a surgical intervention .  The patient's history has been reviewed, patient examined, no change in status, stable for surgery.  I have reviewed the patient's chart and labs.  Questions were answered to the patient's satisfaction.     Irine Seal

## 2017-08-25 DIAGNOSIS — N1330 Unspecified hydronephrosis: Secondary | ICD-10-CM | POA: Diagnosis not present

## 2017-08-25 DIAGNOSIS — N138 Other obstructive and reflux uropathy: Secondary | ICD-10-CM | POA: Diagnosis not present

## 2017-08-25 DIAGNOSIS — C61 Malignant neoplasm of prostate: Secondary | ICD-10-CM | POA: Diagnosis not present

## 2017-08-25 DIAGNOSIS — Z91040 Latex allergy status: Secondary | ICD-10-CM | POA: Diagnosis not present

## 2017-08-25 DIAGNOSIS — N401 Enlarged prostate with lower urinary tract symptoms: Secondary | ICD-10-CM | POA: Diagnosis not present

## 2017-08-25 DIAGNOSIS — R600 Localized edema: Secondary | ICD-10-CM | POA: Diagnosis not present

## 2017-08-25 DIAGNOSIS — Z79899 Other long term (current) drug therapy: Secondary | ICD-10-CM | POA: Diagnosis not present

## 2017-08-25 DIAGNOSIS — N320 Bladder-neck obstruction: Secondary | ICD-10-CM | POA: Diagnosis not present

## 2017-08-25 DIAGNOSIS — N3080 Other cystitis without hematuria: Secondary | ICD-10-CM | POA: Diagnosis not present

## 2017-08-25 NOTE — Discharge Summary (Signed)
Physician Discharge Summary  Patient ID: Galvin Aversa MRN: [id number] DOB/AGE: 11/09/1950 66 y.o.  Admit date: 08/24/2017 Discharge date: 08/25/2017  Admission Diagnoses:  Discharge Diagnoses:  Active Problems:   BPH with obstruction/lower urinary tract symptoms   Discharged Condition: good  Hospital Course: underwent TURP yesterday. Weaned CBI today. Doing well  Consults: None  Significant Diagnostic Studies: none  Treatments: TURP  Discharge Exam: Blood pressure 114/62, pulse 66, temperature 98.3 F (36.8 C), temperature source Oral, resp. rate 16, height 5\' 6"  (1.676 m), weight 63.6 kg (140 lb 4 oz), SpO2 98 %. General appearance: alert  Urine light red off CBI with 3 way foley in place abd soft NTND Adequate perfusion NLR  Disposition: 01-Home or Self Care   Allergies as of 08/25/2017      Reactions   Latex Rash      Medication List    TAKE these medications   ciprofloxacin 500 MG tablet Commonly known as:  CIPRO Take 1 tablet (500 mg total) by mouth 2 (two) times daily. Start on 02/02/17   docusate sodium 100 MG capsule Commonly known as:  COLACE Take 1 capsule (100 mg total) by mouth 2 (two) times daily.   famotidine 20 MG tablet Commonly known as:  PEPCID Take 20 mg by mouth 2 (two) times daily.   multivitamin with minerals tablet Take 1 tablet by mouth daily.      Follow-up Information    Irine Seal, MD On 09/06/2017.   Specialty:  Urology Why:  [address] information: [place] [address] [phone]           Signed: Marton Redwood, III 08/25/2017, 8:21 AM

## 2017-09-08 DIAGNOSIS — R3914 Feeling of incomplete bladder emptying: Secondary | ICD-10-CM | POA: Diagnosis not present

## 2017-10-17 DIAGNOSIS — K219 Gastro-esophageal reflux disease without esophagitis: Secondary | ICD-10-CM | POA: Diagnosis not present

## 2017-10-17 DIAGNOSIS — C61 Malignant neoplasm of prostate: Secondary | ICD-10-CM | POA: Diagnosis not present

## 2017-10-17 DIAGNOSIS — N451 Epididymitis: Secondary | ICD-10-CM | POA: Diagnosis not present

## 2017-10-17 DIAGNOSIS — L988 Other specified disorders of the skin and subcutaneous tissue: Secondary | ICD-10-CM | POA: Diagnosis not present

## 2017-10-17 DIAGNOSIS — Z6822 Body mass index (BMI) 22.0-22.9, adult: Secondary | ICD-10-CM | POA: Diagnosis not present

## 2017-10-17 DIAGNOSIS — Z1389 Encounter for screening for other disorder: Secondary | ICD-10-CM | POA: Diagnosis not present

## 2017-10-23 DIAGNOSIS — N401 Enlarged prostate with lower urinary tract symptoms: Secondary | ICD-10-CM | POA: Diagnosis not present

## 2017-10-23 DIAGNOSIS — R3914 Feeling of incomplete bladder emptying: Secondary | ICD-10-CM | POA: Diagnosis not present

## 2017-11-28 DIAGNOSIS — N401 Enlarged prostate with lower urinary tract symptoms: Secondary | ICD-10-CM | POA: Diagnosis not present

## 2017-12-07 DIAGNOSIS — N453 Epididymo-orchitis: Secondary | ICD-10-CM | POA: Diagnosis not present

## 2017-12-07 DIAGNOSIS — R3914 Feeling of incomplete bladder emptying: Secondary | ICD-10-CM | POA: Diagnosis not present

## 2017-12-07 DIAGNOSIS — C61 Malignant neoplasm of prostate: Secondary | ICD-10-CM | POA: Diagnosis not present

## 2018-02-08 DIAGNOSIS — C61 Malignant neoplasm of prostate: Secondary | ICD-10-CM | POA: Diagnosis not present

## 2018-02-21 IMAGING — US US RENAL
1 series · 14 of 25 positions shown · non-contrast
Comparison: CT scan 08/18/2017

CLINICAL DATA: Hydronephrosis with bladder distention on recent CT.

EXAM:
RENAL / URINARY TRACT ULTRASOUND COMPLETE

[Series 1: us renal · 0.23mm/px · 34 acquisitions, 14 frames shown]
[im 1/34]
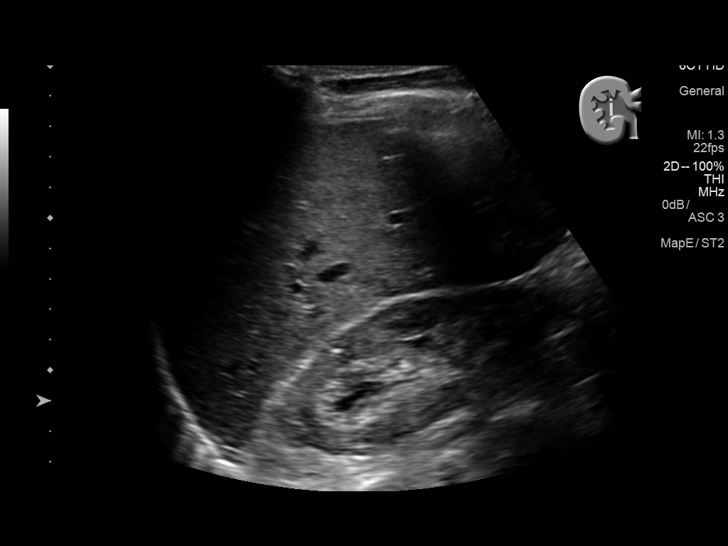
[im 3/34]
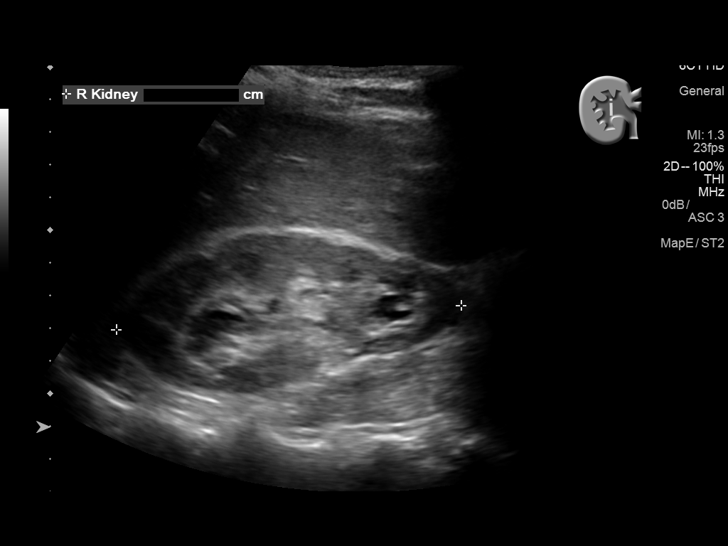
[im 6/34]
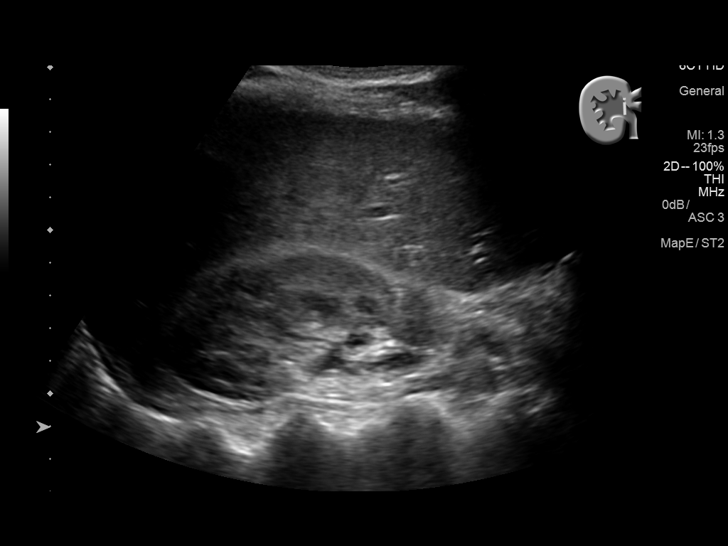
[im 9/34]
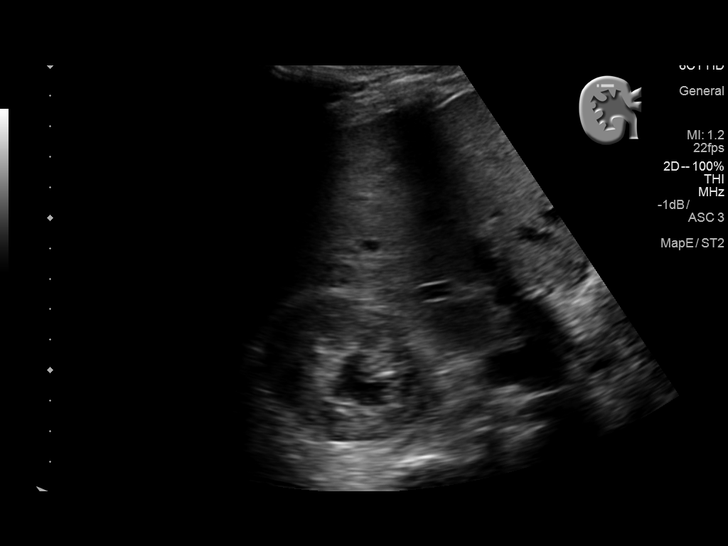
[im 12/34]
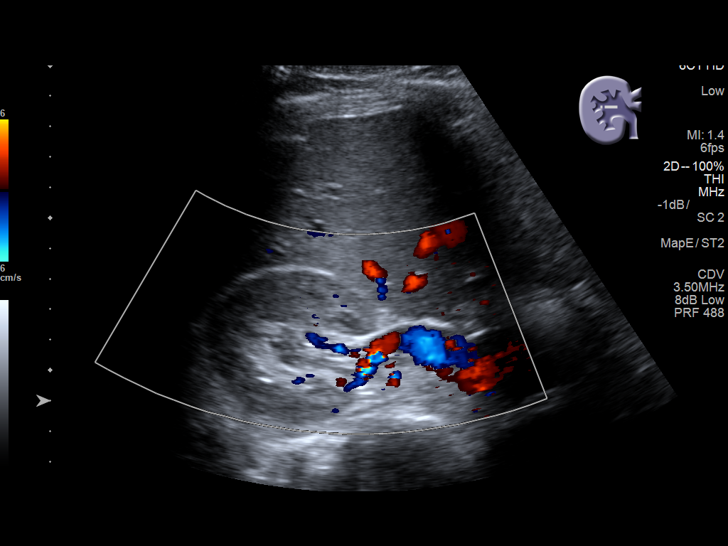
[im 13/34]
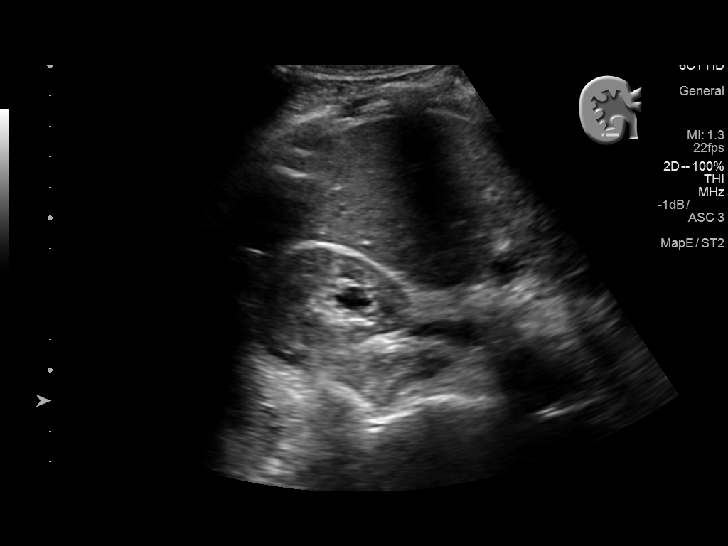
[im 16/34]
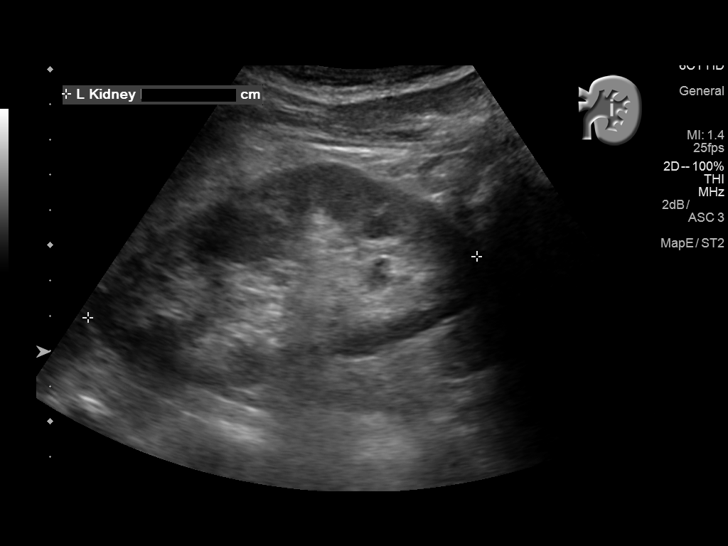
[im 18/34]
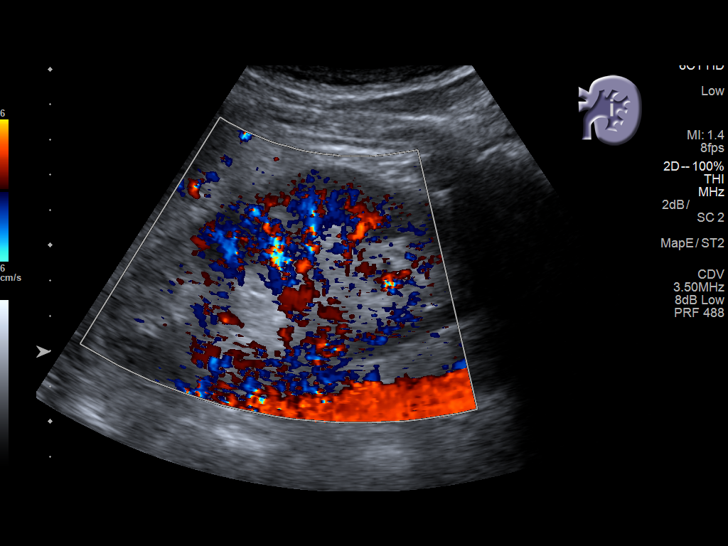
[im 21/34]
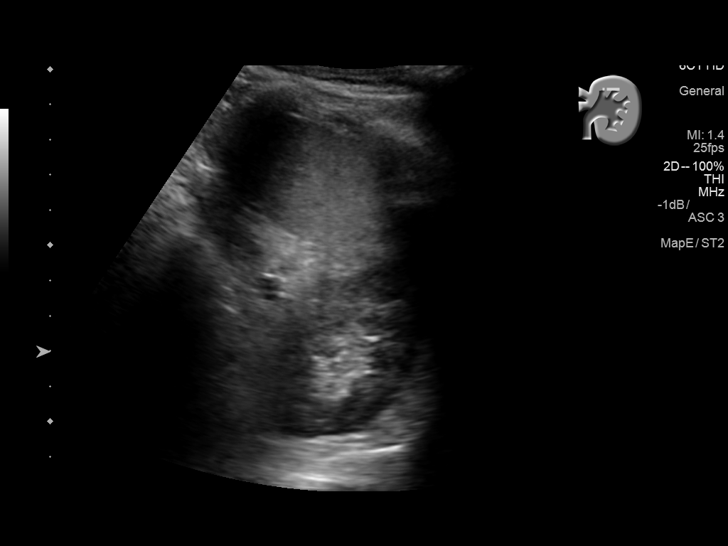
[im 23/34]
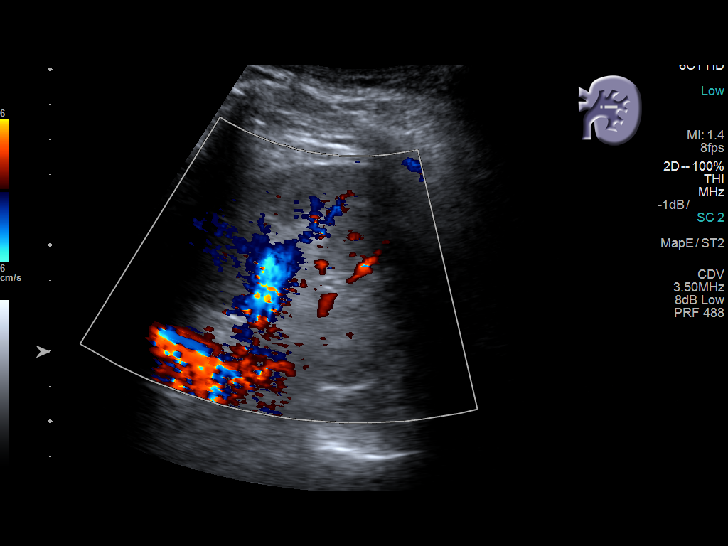
[im 25/34]
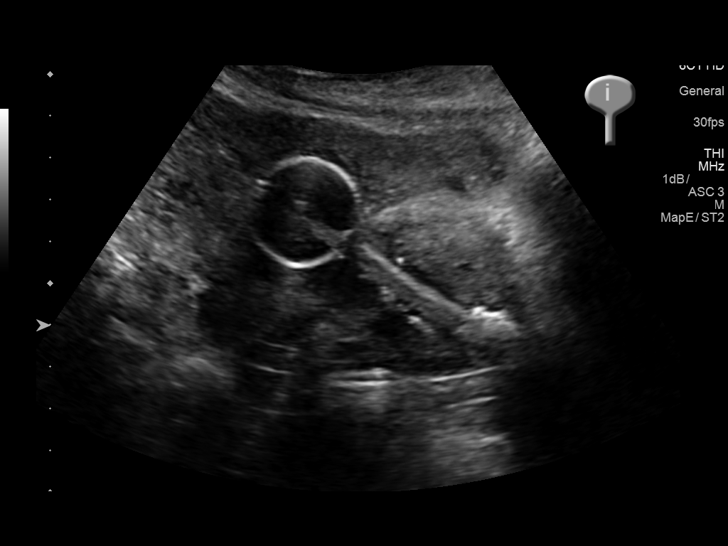
[im 28/34]
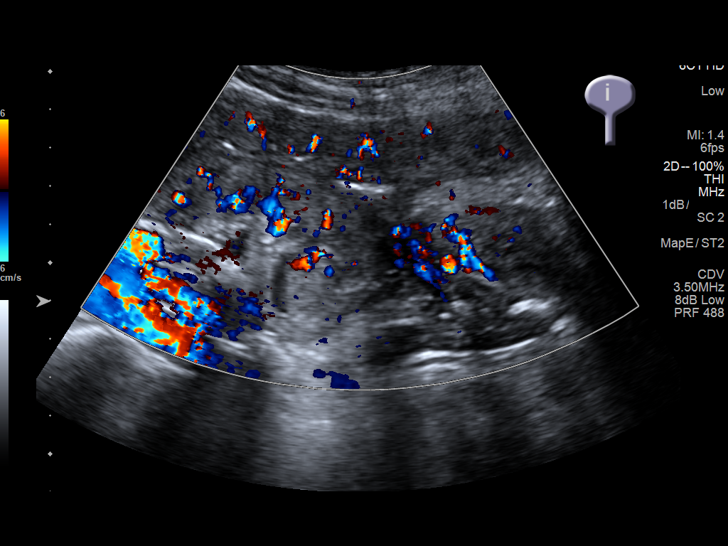
[im 31/34]
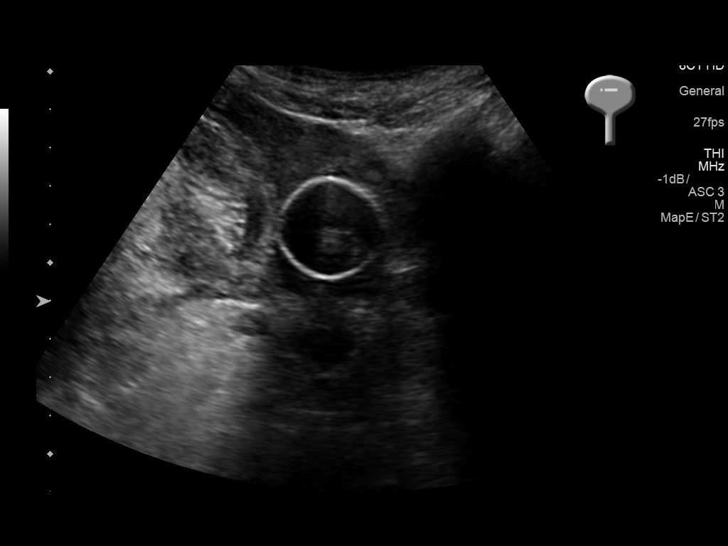
[im 34/34]
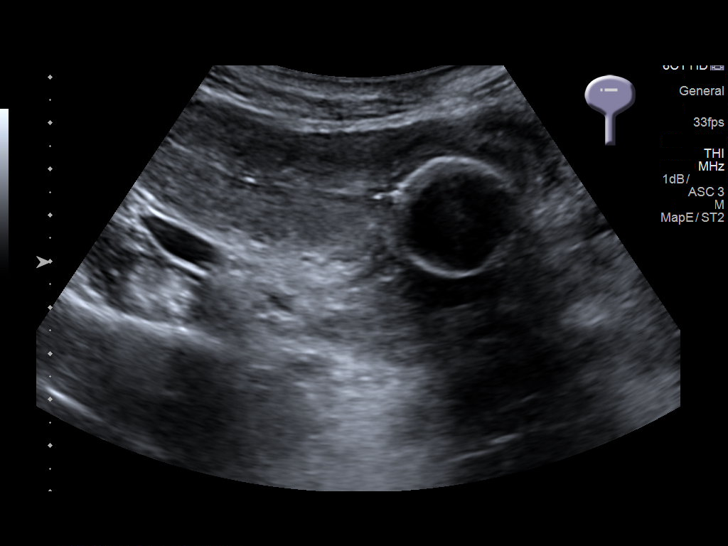

[14 of 25 positions shown; findings below may reference images not displayed]

FINDINGS: Right Kidney:

Length: 10.6 cm. Mild fullness noted intrarenal collecting system
although decreased compared to recent CT.

Left Kidney:

Length: 11.2 cm.  No hydronephrosis.

Bladder:

Appears completely decompressed with Foley catheter in place.
IMPRESSION: Urinary bladder decompressed by a Foley catheter.

Resolution of right hydronephrosis seen on recent CT scan with only
mild fullness of the right intrarenal collecting system on today's
study. No hydronephrosis in the left kidney.

## 2018-03-20 DIAGNOSIS — D2261 Melanocytic nevi of right upper limb, including shoulder: Secondary | ICD-10-CM | POA: Diagnosis not present

## 2018-03-20 DIAGNOSIS — D2272 Melanocytic nevi of left lower limb, including hip: Secondary | ICD-10-CM | POA: Diagnosis not present

## 2018-03-20 DIAGNOSIS — Z85828 Personal history of other malignant neoplasm of skin: Secondary | ICD-10-CM | POA: Diagnosis not present

## 2018-03-20 DIAGNOSIS — D2262 Melanocytic nevi of left upper limb, including shoulder: Secondary | ICD-10-CM | POA: Diagnosis not present

## 2018-03-20 DIAGNOSIS — L570 Actinic keratosis: Secondary | ICD-10-CM | POA: Diagnosis not present

## 2018-03-20 DIAGNOSIS — L821 Other seborrheic keratosis: Secondary | ICD-10-CM | POA: Diagnosis not present

## 2018-03-20 DIAGNOSIS — D1801 Hemangioma of skin and subcutaneous tissue: Secondary | ICD-10-CM | POA: Diagnosis not present

## 2018-03-20 DIAGNOSIS — D225 Melanocytic nevi of trunk: Secondary | ICD-10-CM | POA: Diagnosis not present

## 2018-03-20 DIAGNOSIS — D2271 Melanocytic nevi of right lower limb, including hip: Secondary | ICD-10-CM | POA: Diagnosis not present

## 2018-03-20 DIAGNOSIS — L814 Other melanin hyperpigmentation: Secondary | ICD-10-CM | POA: Diagnosis not present

## 2018-05-08 DIAGNOSIS — M7552 Bursitis of left shoulder: Secondary | ICD-10-CM | POA: Diagnosis not present

## 2018-05-08 DIAGNOSIS — Z6824 Body mass index (BMI) 24.0-24.9, adult: Secondary | ICD-10-CM | POA: Diagnosis not present

## 2018-05-08 DIAGNOSIS — R030 Elevated blood-pressure reading, without diagnosis of hypertension: Secondary | ICD-10-CM | POA: Diagnosis not present

## 2018-06-13 DIAGNOSIS — M25512 Pain in left shoulder: Secondary | ICD-10-CM | POA: Diagnosis not present

## 2018-06-13 DIAGNOSIS — M7522 Bicipital tendinitis, left shoulder: Secondary | ICD-10-CM | POA: Diagnosis not present

## 2018-06-16 DIAGNOSIS — Z23 Encounter for immunization: Secondary | ICD-10-CM | POA: Diagnosis not present

## 2018-07-28 IMAGING — CT CT ABD-PELV W/ CM
2 of 5 series · 16 of 46 positions shown, 18 images · IV contrast (ISOVUE)
Comparison: None.

CLINICAL DATA: Leg swelling

EXAM:
CT ABDOMEN AND PELVIS WITH CONTRAST
TECHNIQUE: Multidetector CT imaging of the abdomen and pelvis was performed
using the standard protocol following bolus administration of
intravenous contrast.
CONTRAST:  100mL [id number] IOPAMIDOL ([id number]) INJECTION 61%

[Series 2: abd/pel with · axial · 0.84mm/px · z∈[-758,-348]mm · 13 of 96 slices shown, 15 images]
[im 7/96  soft-tissue]
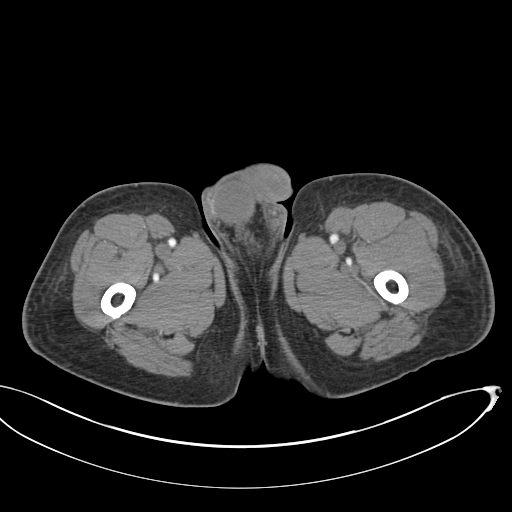
[im 7/96  bone]
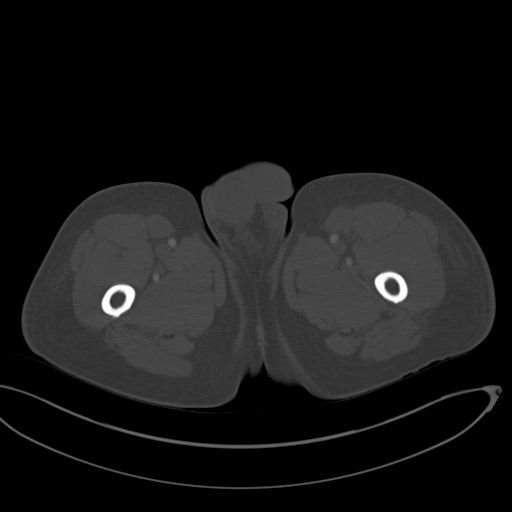
[im 14/96  soft-tissue]
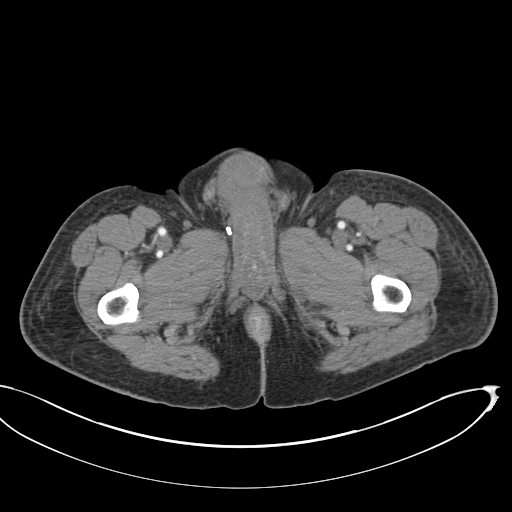
[im 21/96  soft-tissue]
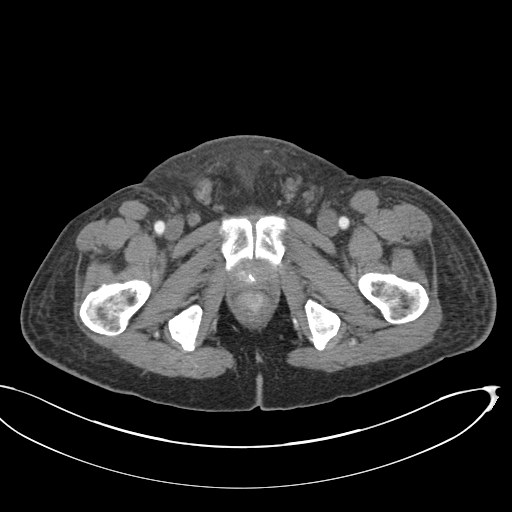
[im 28/96  soft-tissue]
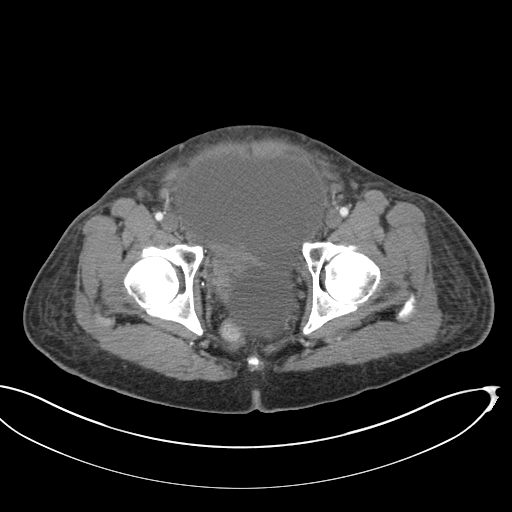
[im 34/96  soft-tissue]
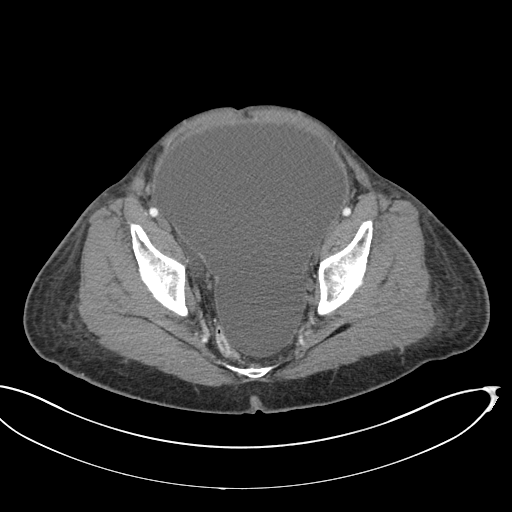
[im 41/96  soft-tissue]
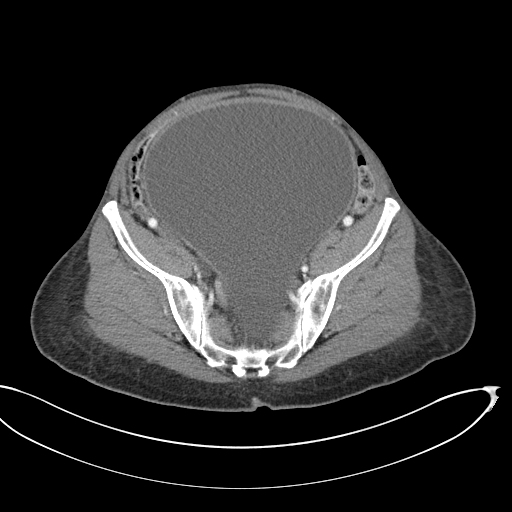
[im 48/96  soft-tissue]
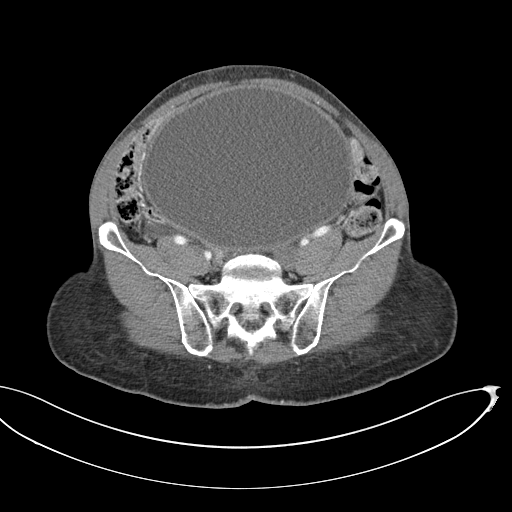
[im 55/96  soft-tissue]
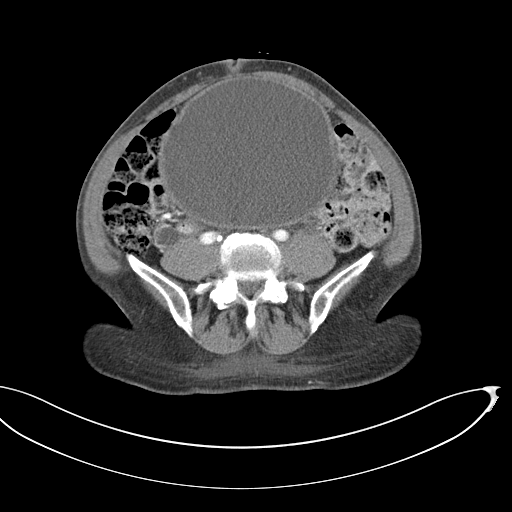
[im 62/96  soft-tissue]
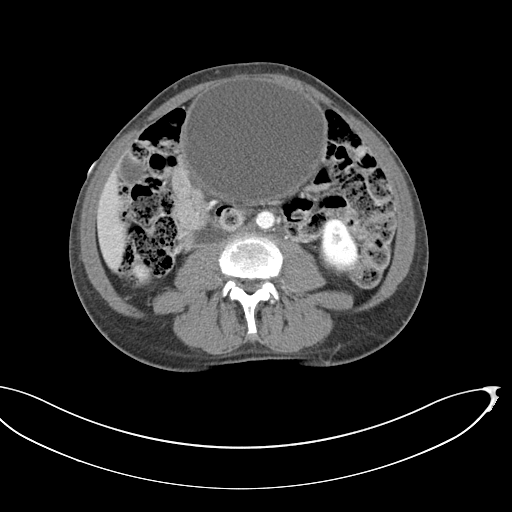
[im 62/96  bone]
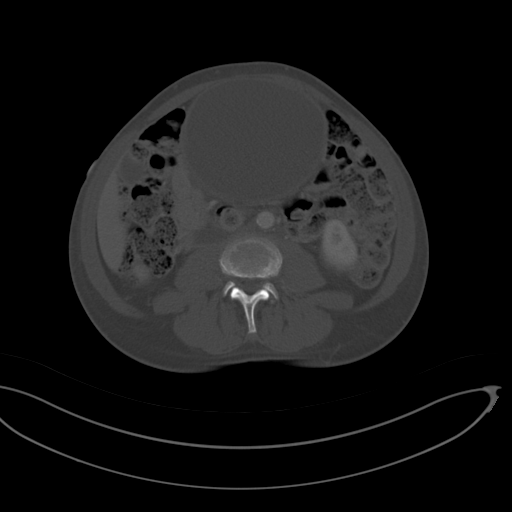
[im 68/96  soft-tissue]
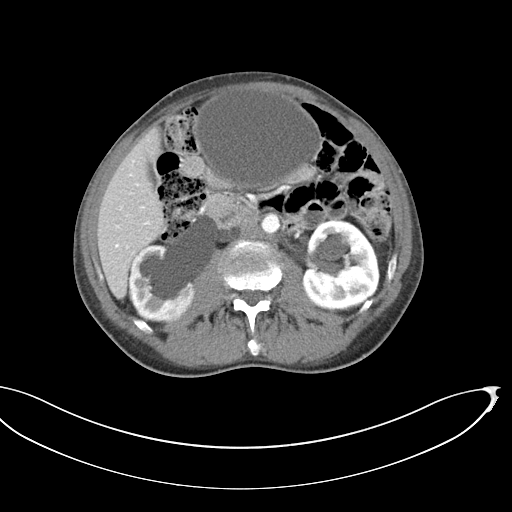
[im 75/96  soft-tissue]
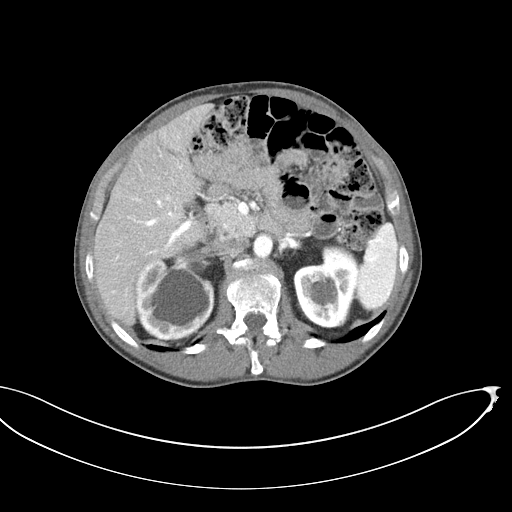
[im 82/96  soft-tissue]
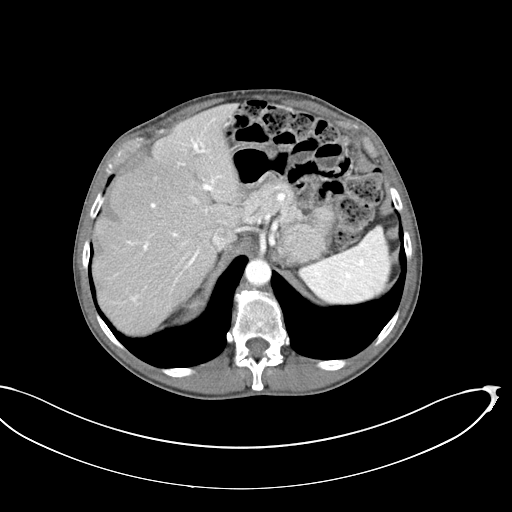
[im 89/96  soft-tissue]
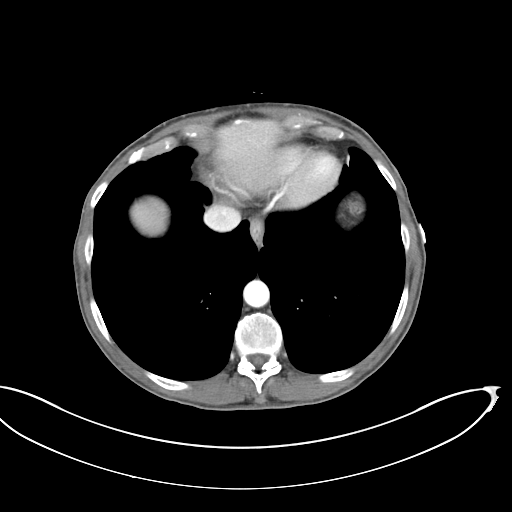

[Series 4: coronal a/|p · coronal · 0.74mm/px · 3 of 178 slices shown]
[im 60/178  soft-tissue]
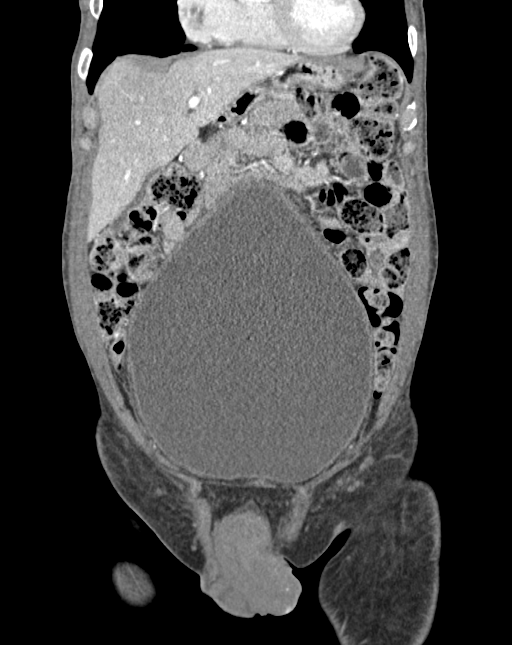
[im 79/178  soft-tissue]
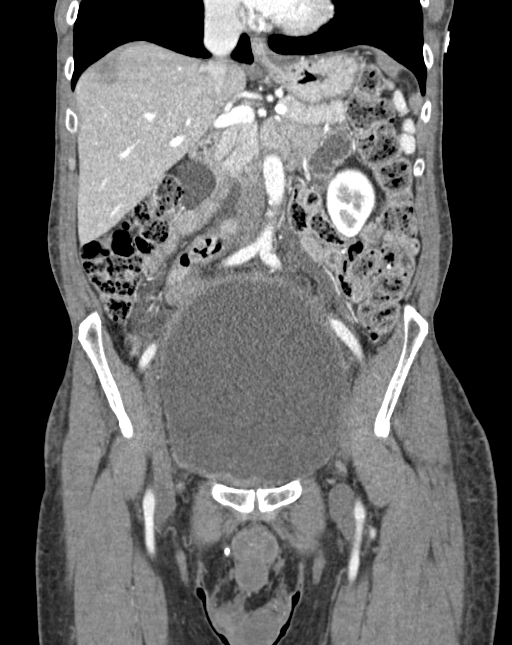
[im 99/178  soft-tissue]
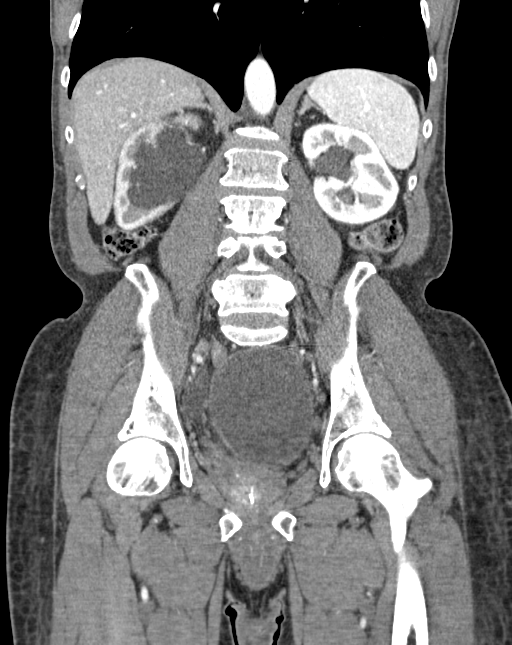

[16 of 46 positions shown; findings below may reference images not displayed]

FINDINGS: Lower chest: Lung bases demonstrate no acute consolidation or
pleural effusion. Normal heart size.

Hepatobiliary: No focal liver abnormality is seen. No gallstones,
gallbladder wall thickening, or biliary dilatation.

Pancreas: Unremarkable. No pancreatic ductal dilatation or
surrounding inflammatory changes.

Spleen: Normal in size without focal abnormality.

Adrenals/Urinary Tract: Adrenal glands are within normal limits.
Severe bilateral hydronephrosis and hydroureter, right greater than
left. Massive over distention of the bladder which extends into the
upper abdomen. There is displacement of the bowel to the periphery
by the markedly enlarged bladder.

Stomach/Bowel: Stomach within normal limits. No dilated small bowel.
No colon wall thickening.

Vascular/Lymphatic: Nonaneurysmal aorta. Atherosclerosis. No
significantly enlarged lymph nodes

Reproductive: Prostate enlargement. Somewhat infiltrative appearing
soft tissue density at the base of the bladder, appears contiguous
with prostate gland.

Other: Negative for free air or ascites. Diffuse subcutaneous edema
within the pelvis and proximal thighs.

Musculoskeletal: No acute or significant osseous findings.
IMPRESSION: 1. Massive distension of the urinary bladder, resulting in severe
bilateral hydronephrosis and hydroureter. Findings would be
concerning for outlet obstruction. Somewhat infiltrating appearing
mass or soft tissue density at the bladder base which appears
contiguous with the prostate gland for which clinical correlation is
recommended.
2. There are no other acute abnormalities visualized.

## 2018-08-01 DIAGNOSIS — C61 Malignant neoplasm of prostate: Secondary | ICD-10-CM | POA: Diagnosis not present

## 2018-08-22 DIAGNOSIS — R351 Nocturia: Secondary | ICD-10-CM | POA: Diagnosis not present

## 2018-08-22 DIAGNOSIS — N401 Enlarged prostate with lower urinary tract symptoms: Secondary | ICD-10-CM | POA: Diagnosis not present

## 2018-08-22 DIAGNOSIS — C61 Malignant neoplasm of prostate: Secondary | ICD-10-CM | POA: Diagnosis not present

## 2018-09-26 DIAGNOSIS — M25512 Pain in left shoulder: Secondary | ICD-10-CM | POA: Diagnosis not present

## 2018-11-21 DIAGNOSIS — Z125 Encounter for screening for malignant neoplasm of prostate: Secondary | ICD-10-CM | POA: Diagnosis not present

## 2018-11-21 DIAGNOSIS — Z0000 Encounter for general adult medical examination without abnormal findings: Secondary | ICD-10-CM | POA: Diagnosis not present

## 2018-11-21 DIAGNOSIS — R82998 Other abnormal findings in urine: Secondary | ICD-10-CM | POA: Diagnosis not present

## 2018-12-06 DIAGNOSIS — K219 Gastro-esophageal reflux disease without esophagitis: Secondary | ICD-10-CM | POA: Diagnosis not present

## 2018-12-06 DIAGNOSIS — Z6823 Body mass index (BMI) 23.0-23.9, adult: Secondary | ICD-10-CM | POA: Diagnosis not present

## 2018-12-06 DIAGNOSIS — C61 Malignant neoplasm of prostate: Secondary | ICD-10-CM | POA: Diagnosis not present

## 2018-12-06 DIAGNOSIS — Z1331 Encounter for screening for depression: Secondary | ICD-10-CM | POA: Diagnosis not present

## 2018-12-06 DIAGNOSIS — Z0000 Encounter for general adult medical examination without abnormal findings: Secondary | ICD-10-CM | POA: Diagnosis not present

## 2018-12-06 DIAGNOSIS — N528 Other male erectile dysfunction: Secondary | ICD-10-CM | POA: Diagnosis not present

## 2018-12-06 DIAGNOSIS — R200 Anesthesia of skin: Secondary | ICD-10-CM | POA: Diagnosis not present

## 2019-01-10 DIAGNOSIS — M25512 Pain in left shoulder: Secondary | ICD-10-CM | POA: Diagnosis not present

## 2019-02-13 DIAGNOSIS — C61 Malignant neoplasm of prostate: Secondary | ICD-10-CM | POA: Diagnosis not present

## 2019-03-23 DIAGNOSIS — S90852A Superficial foreign body, left foot, initial encounter: Secondary | ICD-10-CM | POA: Diagnosis not present

## 2019-03-23 DIAGNOSIS — L02619 Cutaneous abscess of unspecified foot: Secondary | ICD-10-CM | POA: Diagnosis not present

## 2019-03-23 DIAGNOSIS — Z23 Encounter for immunization: Secondary | ICD-10-CM | POA: Diagnosis not present

## 2019-03-26 DIAGNOSIS — L821 Other seborrheic keratosis: Secondary | ICD-10-CM | POA: Diagnosis not present

## 2019-03-26 DIAGNOSIS — L2089 Other atopic dermatitis: Secondary | ICD-10-CM | POA: Diagnosis not present

## 2019-03-26 DIAGNOSIS — L570 Actinic keratosis: Secondary | ICD-10-CM | POA: Diagnosis not present

## 2019-03-26 DIAGNOSIS — D2261 Melanocytic nevi of right upper limb, including shoulder: Secondary | ICD-10-CM | POA: Diagnosis not present

## 2019-03-26 DIAGNOSIS — D1801 Hemangioma of skin and subcutaneous tissue: Secondary | ICD-10-CM | POA: Diagnosis not present

## 2019-03-26 DIAGNOSIS — D225 Melanocytic nevi of trunk: Secondary | ICD-10-CM | POA: Diagnosis not present

## 2019-03-26 DIAGNOSIS — L814 Other melanin hyperpigmentation: Secondary | ICD-10-CM | POA: Diagnosis not present

## 2019-04-01 ENCOUNTER — Other Ambulatory Visit: Payer: Self-pay | Admitting: Urology

## 2019-04-01 DIAGNOSIS — N5201 Erectile dysfunction due to arterial insufficiency: Secondary | ICD-10-CM | POA: Diagnosis not present

## 2019-04-01 DIAGNOSIS — R351 Nocturia: Secondary | ICD-10-CM | POA: Diagnosis not present

## 2019-04-01 DIAGNOSIS — C61 Malignant neoplasm of prostate: Secondary | ICD-10-CM | POA: Diagnosis not present

## 2019-04-24 DIAGNOSIS — M25521 Pain in right elbow: Secondary | ICD-10-CM | POA: Diagnosis not present

## 2019-04-25 ENCOUNTER — Other Ambulatory Visit: Payer: Medicare Other

## 2019-05-14 DIAGNOSIS — M25521 Pain in right elbow: Secondary | ICD-10-CM | POA: Diagnosis not present

## 2019-05-21 DIAGNOSIS — M25521 Pain in right elbow: Secondary | ICD-10-CM | POA: Diagnosis not present

## 2019-05-21 DIAGNOSIS — S46291A Other injury of muscle, fascia and tendon of other parts of biceps, right arm, initial encounter: Secondary | ICD-10-CM | POA: Diagnosis not present

## 2019-05-22 ENCOUNTER — Other Ambulatory Visit: Payer: Self-pay

## 2019-05-22 ENCOUNTER — Ambulatory Visit
Admission: RE | Admit: 2019-05-22 | Discharge: 2019-05-22 | Disposition: A | Discharge status: Home / Self Care | Payer: Federal, State, Local not specified - PPO | Source: Ambulatory Visit | Attending: Urology | Admitting: Urology

## 2019-05-22 DIAGNOSIS — C61 Malignant neoplasm of prostate: Secondary | ICD-10-CM

## 2019-05-22 DIAGNOSIS — R972 Elevated prostate specific antigen [PSA]: Secondary | ICD-10-CM | POA: Diagnosis not present

## 2019-05-22 MED ORDER — GADOBENATE DIMEGLUMINE 529 MG/ML IV SOLN
12.0000 mL | Freq: Once | INTRAVENOUS | Status: AC | PRN
  Administered 2019-05-22: 12 mL via INTRAVENOUS

## 2019-05-31 DIAGNOSIS — Z23 Encounter for immunization: Secondary | ICD-10-CM | POA: Diagnosis not present

## 2019-06-17 DIAGNOSIS — L820 Inflamed seborrheic keratosis: Secondary | ICD-10-CM | POA: Diagnosis not present

## 2019-07-02 DIAGNOSIS — M25512 Pain in left shoulder: Secondary | ICD-10-CM | POA: Diagnosis not present

## 2019-07-02 DIAGNOSIS — S46291A Other injury of muscle, fascia and tendon of other parts of biceps, right arm, initial encounter: Secondary | ICD-10-CM | POA: Diagnosis not present

## 2019-07-02 DIAGNOSIS — M25521 Pain in right elbow: Secondary | ICD-10-CM | POA: Diagnosis not present

## 2019-08-29 DIAGNOSIS — S46291A Other injury of muscle, fascia and tendon of other parts of biceps, right arm, initial encounter: Secondary | ICD-10-CM | POA: Diagnosis not present

## 2019-08-29 DIAGNOSIS — M25521 Pain in right elbow: Secondary | ICD-10-CM | POA: Diagnosis not present

## 2019-10-16 DIAGNOSIS — C61 Malignant neoplasm of prostate: Secondary | ICD-10-CM | POA: Diagnosis not present

## 2019-10-23 DIAGNOSIS — N5201 Erectile dysfunction due to arterial insufficiency: Secondary | ICD-10-CM | POA: Diagnosis not present

## 2019-10-23 DIAGNOSIS — C61 Malignant neoplasm of prostate: Secondary | ICD-10-CM | POA: Diagnosis not present

## 2019-11-25 DIAGNOSIS — X58XXXA Exposure to other specified factors, initial encounter: Secondary | ICD-10-CM | POA: Diagnosis not present

## 2019-11-25 DIAGNOSIS — Y999 Unspecified external cause status: Secondary | ICD-10-CM | POA: Diagnosis not present

## 2019-11-25 DIAGNOSIS — S46291A Other injury of muscle, fascia and tendon of other parts of biceps, right arm, initial encounter: Secondary | ICD-10-CM | POA: Diagnosis not present

## 2019-12-05 DIAGNOSIS — M25621 Stiffness of right elbow, not elsewhere classified: Secondary | ICD-10-CM | POA: Diagnosis not present

## 2019-12-11 DIAGNOSIS — Z125 Encounter for screening for malignant neoplasm of prostate: Secondary | ICD-10-CM | POA: Diagnosis not present

## 2019-12-11 DIAGNOSIS — Z0000 Encounter for general adult medical examination without abnormal findings: Secondary | ICD-10-CM | POA: Diagnosis not present

## 2019-12-11 DIAGNOSIS — Z79899 Other long term (current) drug therapy: Secondary | ICD-10-CM | POA: Diagnosis not present

## 2019-12-16 DIAGNOSIS — M25629 Stiffness of unspecified elbow, not elsewhere classified: Secondary | ICD-10-CM | POA: Diagnosis not present

## 2019-12-19 DIAGNOSIS — C61 Malignant neoplasm of prostate: Secondary | ICD-10-CM | POA: Diagnosis not present

## 2019-12-19 DIAGNOSIS — G4700 Insomnia, unspecified: Secondary | ICD-10-CM | POA: Diagnosis not present

## 2019-12-19 DIAGNOSIS — Z0000 Encounter for general adult medical examination without abnormal findings: Secondary | ICD-10-CM | POA: Diagnosis not present

## 2019-12-19 DIAGNOSIS — N529 Male erectile dysfunction, unspecified: Secondary | ICD-10-CM | POA: Diagnosis not present

## 2019-12-19 DIAGNOSIS — K219 Gastro-esophageal reflux disease without esophagitis: Secondary | ICD-10-CM | POA: Diagnosis not present

## 2019-12-24 DIAGNOSIS — M25629 Stiffness of unspecified elbow, not elsewhere classified: Secondary | ICD-10-CM | POA: Diagnosis not present

## 2020-01-01 DIAGNOSIS — M25629 Stiffness of unspecified elbow, not elsewhere classified: Secondary | ICD-10-CM | POA: Diagnosis not present

## 2020-01-01 DIAGNOSIS — M25621 Stiffness of right elbow, not elsewhere classified: Secondary | ICD-10-CM | POA: Diagnosis not present

## 2020-01-08 DIAGNOSIS — M25621 Stiffness of right elbow, not elsewhere classified: Secondary | ICD-10-CM | POA: Diagnosis not present

## 2020-01-08 DIAGNOSIS — M25629 Stiffness of unspecified elbow, not elsewhere classified: Secondary | ICD-10-CM | POA: Diagnosis not present

## 2020-01-15 DIAGNOSIS — M25629 Stiffness of unspecified elbow, not elsewhere classified: Secondary | ICD-10-CM | POA: Diagnosis not present

## 2020-01-16 DIAGNOSIS — R0683 Snoring: Secondary | ICD-10-CM | POA: Diagnosis not present

## 2020-01-16 DIAGNOSIS — G4710 Hypersomnia, unspecified: Secondary | ICD-10-CM | POA: Diagnosis not present

## 2020-01-23 DIAGNOSIS — M25629 Stiffness of unspecified elbow, not elsewhere classified: Secondary | ICD-10-CM | POA: Diagnosis not present

## 2020-01-29 DIAGNOSIS — M25629 Stiffness of unspecified elbow, not elsewhere classified: Secondary | ICD-10-CM | POA: Diagnosis not present

## 2020-02-04 DIAGNOSIS — M25621 Stiffness of right elbow, not elsewhere classified: Secondary | ICD-10-CM | POA: Diagnosis not present

## 2020-02-04 DIAGNOSIS — M25629 Stiffness of unspecified elbow, not elsewhere classified: Secondary | ICD-10-CM | POA: Diagnosis not present

## 2020-02-10 DIAGNOSIS — M25621 Stiffness of right elbow, not elsewhere classified: Secondary | ICD-10-CM | POA: Diagnosis not present

## 2020-02-10 DIAGNOSIS — M25629 Stiffness of unspecified elbow, not elsewhere classified: Secondary | ICD-10-CM | POA: Diagnosis not present

## 2020-02-15 DIAGNOSIS — G4733 Obstructive sleep apnea (adult) (pediatric): Secondary | ICD-10-CM | POA: Diagnosis not present

## 2020-02-24 DIAGNOSIS — M25629 Stiffness of unspecified elbow, not elsewhere classified: Secondary | ICD-10-CM | POA: Diagnosis not present

## 2020-02-24 DIAGNOSIS — M25621 Stiffness of right elbow, not elsewhere classified: Secondary | ICD-10-CM | POA: Diagnosis not present

## 2020-03-03 DIAGNOSIS — G4710 Hypersomnia, unspecified: Secondary | ICD-10-CM | POA: Diagnosis not present

## 2020-03-03 DIAGNOSIS — G4733 Obstructive sleep apnea (adult) (pediatric): Secondary | ICD-10-CM | POA: Diagnosis not present

## 2020-03-04 DIAGNOSIS — M25629 Stiffness of unspecified elbow, not elsewhere classified: Secondary | ICD-10-CM | POA: Diagnosis not present

## 2020-03-12 DIAGNOSIS — M25629 Stiffness of unspecified elbow, not elsewhere classified: Secondary | ICD-10-CM | POA: Diagnosis not present

## 2020-03-17 DIAGNOSIS — G4710 Hypersomnia, unspecified: Secondary | ICD-10-CM | POA: Diagnosis not present

## 2020-03-17 DIAGNOSIS — G4733 Obstructive sleep apnea (adult) (pediatric): Secondary | ICD-10-CM | POA: Diagnosis not present

## 2020-03-25 DIAGNOSIS — M25629 Stiffness of unspecified elbow, not elsewhere classified: Secondary | ICD-10-CM | POA: Diagnosis not present

## 2020-03-31 DIAGNOSIS — D1801 Hemangioma of skin and subcutaneous tissue: Secondary | ICD-10-CM | POA: Diagnosis not present

## 2020-03-31 DIAGNOSIS — D2261 Melanocytic nevi of right upper limb, including shoulder: Secondary | ICD-10-CM | POA: Diagnosis not present

## 2020-03-31 DIAGNOSIS — C44319 Basal cell carcinoma of skin of other parts of face: Secondary | ICD-10-CM | POA: Diagnosis not present

## 2020-03-31 DIAGNOSIS — Z85828 Personal history of other malignant neoplasm of skin: Secondary | ICD-10-CM | POA: Diagnosis not present

## 2020-03-31 DIAGNOSIS — D2271 Melanocytic nevi of right lower limb, including hip: Secondary | ICD-10-CM | POA: Diagnosis not present

## 2020-03-31 DIAGNOSIS — L821 Other seborrheic keratosis: Secondary | ICD-10-CM | POA: Diagnosis not present

## 2020-03-31 DIAGNOSIS — L570 Actinic keratosis: Secondary | ICD-10-CM | POA: Diagnosis not present

## 2020-03-31 DIAGNOSIS — L308 Other specified dermatitis: Secondary | ICD-10-CM | POA: Diagnosis not present

## 2020-03-31 DIAGNOSIS — D2272 Melanocytic nevi of left lower limb, including hip: Secondary | ICD-10-CM | POA: Diagnosis not present

## 2020-04-07 DIAGNOSIS — S46291A Other injury of muscle, fascia and tendon of other parts of biceps, right arm, initial encounter: Secondary | ICD-10-CM | POA: Diagnosis not present

## 2020-04-15 DIAGNOSIS — C61 Malignant neoplasm of prostate: Secondary | ICD-10-CM | POA: Diagnosis not present

## 2020-04-16 DIAGNOSIS — G4710 Hypersomnia, unspecified: Secondary | ICD-10-CM | POA: Diagnosis not present

## 2020-04-16 DIAGNOSIS — G4733 Obstructive sleep apnea (adult) (pediatric): Secondary | ICD-10-CM | POA: Diagnosis not present

## 2020-04-22 DIAGNOSIS — C61 Malignant neoplasm of prostate: Secondary | ICD-10-CM | POA: Diagnosis not present

## 2020-04-22 DIAGNOSIS — N5201 Erectile dysfunction due to arterial insufficiency: Secondary | ICD-10-CM | POA: Diagnosis not present

## 2020-04-22 DIAGNOSIS — R351 Nocturia: Secondary | ICD-10-CM | POA: Diagnosis not present

## 2020-04-29 DIAGNOSIS — U071 COVID-19: Secondary | ICD-10-CM | POA: Diagnosis not present

## 2020-04-29 DIAGNOSIS — R0981 Nasal congestion: Secondary | ICD-10-CM | POA: Diagnosis not present

## 2020-04-30 DIAGNOSIS — R05 Cough: Secondary | ICD-10-CM | POA: Diagnosis not present

## 2020-04-30 DIAGNOSIS — U071 COVID-19: Secondary | ICD-10-CM | POA: Diagnosis not present

## 2020-05-12 DIAGNOSIS — R0683 Snoring: Secondary | ICD-10-CM | POA: Diagnosis not present

## 2020-05-12 DIAGNOSIS — G4710 Hypersomnia, unspecified: Secondary | ICD-10-CM | POA: Diagnosis not present

## 2020-05-12 DIAGNOSIS — G4733 Obstructive sleep apnea (adult) (pediatric): Secondary | ICD-10-CM | POA: Diagnosis not present

## 2020-05-17 DIAGNOSIS — G4710 Hypersomnia, unspecified: Secondary | ICD-10-CM | POA: Diagnosis not present

## 2020-05-17 DIAGNOSIS — G4733 Obstructive sleep apnea (adult) (pediatric): Secondary | ICD-10-CM | POA: Diagnosis not present

## 2020-06-08 DIAGNOSIS — L03032 Cellulitis of left toe: Secondary | ICD-10-CM | POA: Diagnosis not present

## 2020-06-17 DIAGNOSIS — G4733 Obstructive sleep apnea (adult) (pediatric): Secondary | ICD-10-CM | POA: Diagnosis not present

## 2020-06-17 DIAGNOSIS — G4710 Hypersomnia, unspecified: Secondary | ICD-10-CM | POA: Diagnosis not present

## 2020-06-27 DIAGNOSIS — G4733 Obstructive sleep apnea (adult) (pediatric): Secondary | ICD-10-CM | POA: Diagnosis not present

## 2020-06-27 DIAGNOSIS — Z23 Encounter for immunization: Secondary | ICD-10-CM | POA: Diagnosis not present

## 2020-07-02 DIAGNOSIS — R0683 Snoring: Secondary | ICD-10-CM | POA: Diagnosis not present

## 2020-07-02 DIAGNOSIS — G4710 Hypersomnia, unspecified: Secondary | ICD-10-CM | POA: Diagnosis not present

## 2020-07-02 DIAGNOSIS — Z9989 Dependence on other enabling machines and devices: Secondary | ICD-10-CM | POA: Diagnosis not present

## 2020-07-02 DIAGNOSIS — G4733 Obstructive sleep apnea (adult) (pediatric): Secondary | ICD-10-CM | POA: Diagnosis not present

## 2020-07-03 DIAGNOSIS — L600 Ingrowing nail: Secondary | ICD-10-CM | POA: Diagnosis not present

## 2020-07-07 DIAGNOSIS — M898X7 Other specified disorders of bone, ankle and foot: Secondary | ICD-10-CM | POA: Diagnosis not present

## 2020-07-07 DIAGNOSIS — L600 Ingrowing nail: Secondary | ICD-10-CM | POA: Diagnosis not present

## 2020-07-07 DIAGNOSIS — M79675 Pain in left toe(s): Secondary | ICD-10-CM | POA: Diagnosis not present

## 2020-07-17 DIAGNOSIS — G4733 Obstructive sleep apnea (adult) (pediatric): Secondary | ICD-10-CM | POA: Diagnosis not present

## 2020-07-17 DIAGNOSIS — G4710 Hypersomnia, unspecified: Secondary | ICD-10-CM | POA: Diagnosis not present

## 2020-07-21 DIAGNOSIS — L600 Ingrowing nail: Secondary | ICD-10-CM | POA: Diagnosis not present

## 2020-07-21 DIAGNOSIS — B351 Tinea unguium: Secondary | ICD-10-CM | POA: Diagnosis not present

## 2020-07-25 DIAGNOSIS — Z23 Encounter for immunization: Secondary | ICD-10-CM | POA: Diagnosis not present

## 2020-08-05 DIAGNOSIS — R059 Cough, unspecified: Secondary | ICD-10-CM | POA: Diagnosis not present

## 2020-08-05 DIAGNOSIS — J302 Other seasonal allergic rhinitis: Secondary | ICD-10-CM | POA: Diagnosis not present

## 2020-08-17 DIAGNOSIS — G4710 Hypersomnia, unspecified: Secondary | ICD-10-CM | POA: Diagnosis not present

## 2020-08-17 DIAGNOSIS — G4733 Obstructive sleep apnea (adult) (pediatric): Secondary | ICD-10-CM | POA: Diagnosis not present

## 2020-09-16 DIAGNOSIS — G4733 Obstructive sleep apnea (adult) (pediatric): Secondary | ICD-10-CM | POA: Diagnosis not present

## 2020-09-16 DIAGNOSIS — G4710 Hypersomnia, unspecified: Secondary | ICD-10-CM | POA: Diagnosis not present

## 2020-10-17 DIAGNOSIS — G4710 Hypersomnia, unspecified: Secondary | ICD-10-CM | POA: Diagnosis not present

## 2020-10-17 DIAGNOSIS — G4733 Obstructive sleep apnea (adult) (pediatric): Secondary | ICD-10-CM | POA: Diagnosis not present

## 2020-10-28 DIAGNOSIS — C61 Malignant neoplasm of prostate: Secondary | ICD-10-CM | POA: Diagnosis not present

## 2020-10-28 DIAGNOSIS — N401 Enlarged prostate with lower urinary tract symptoms: Secondary | ICD-10-CM | POA: Diagnosis not present

## 2020-10-28 DIAGNOSIS — R351 Nocturia: Secondary | ICD-10-CM | POA: Diagnosis not present

## 2020-11-02 DIAGNOSIS — N401 Enlarged prostate with lower urinary tract symptoms: Secondary | ICD-10-CM | POA: Diagnosis not present

## 2020-11-02 DIAGNOSIS — R351 Nocturia: Secondary | ICD-10-CM | POA: Diagnosis not present

## 2020-11-02 DIAGNOSIS — N5201 Erectile dysfunction due to arterial insufficiency: Secondary | ICD-10-CM | POA: Diagnosis not present

## 2020-11-02 DIAGNOSIS — C61 Malignant neoplasm of prostate: Secondary | ICD-10-CM | POA: Diagnosis not present

## 2020-11-19 DIAGNOSIS — Z85828 Personal history of other malignant neoplasm of skin: Secondary | ICD-10-CM | POA: Diagnosis not present

## 2020-11-19 DIAGNOSIS — L820 Inflamed seborrheic keratosis: Secondary | ICD-10-CM | POA: Diagnosis not present

## 2020-12-17 DIAGNOSIS — R030 Elevated blood-pressure reading, without diagnosis of hypertension: Secondary | ICD-10-CM | POA: Diagnosis not present

## 2020-12-17 DIAGNOSIS — R7989 Other specified abnormal findings of blood chemistry: Secondary | ICD-10-CM | POA: Diagnosis not present

## 2020-12-17 DIAGNOSIS — N529 Male erectile dysfunction, unspecified: Secondary | ICD-10-CM | POA: Diagnosis not present

## 2020-12-17 DIAGNOSIS — Z125 Encounter for screening for malignant neoplasm of prostate: Secondary | ICD-10-CM | POA: Diagnosis not present

## 2020-12-24 DIAGNOSIS — Z0000 Encounter for general adult medical examination without abnormal findings: Secondary | ICD-10-CM | POA: Diagnosis not present

## 2020-12-24 DIAGNOSIS — R140 Abdominal distension (gaseous): Secondary | ICD-10-CM | POA: Diagnosis not present

## 2020-12-24 DIAGNOSIS — R82998 Other abnormal findings in urine: Secondary | ICD-10-CM | POA: Diagnosis not present

## 2020-12-24 DIAGNOSIS — C61 Malignant neoplasm of prostate: Secondary | ICD-10-CM | POA: Diagnosis not present

## 2020-12-24 DIAGNOSIS — J302 Other seasonal allergic rhinitis: Secondary | ICD-10-CM | POA: Diagnosis not present

## 2020-12-24 DIAGNOSIS — N529 Male erectile dysfunction, unspecified: Secondary | ICD-10-CM | POA: Diagnosis not present

## 2020-12-24 DIAGNOSIS — K219 Gastro-esophageal reflux disease without esophagitis: Secondary | ICD-10-CM | POA: Diagnosis not present

## 2020-12-24 DIAGNOSIS — Z1212 Encounter for screening for malignant neoplasm of rectum: Secondary | ICD-10-CM | POA: Diagnosis not present

## 2020-12-24 DIAGNOSIS — G4700 Insomnia, unspecified: Secondary | ICD-10-CM | POA: Diagnosis not present

## 2020-12-24 DIAGNOSIS — R030 Elevated blood-pressure reading, without diagnosis of hypertension: Secondary | ICD-10-CM | POA: Diagnosis not present

## 2021-01-12 DIAGNOSIS — M25561 Pain in right knee: Secondary | ICD-10-CM | POA: Diagnosis not present

## 2021-02-04 DIAGNOSIS — M25561 Pain in right knee: Secondary | ICD-10-CM | POA: Diagnosis not present

## 2021-02-11 DIAGNOSIS — S83206A Unspecified tear of unspecified meniscus, current injury, right knee, initial encounter: Secondary | ICD-10-CM | POA: Diagnosis not present

## 2021-03-25 DIAGNOSIS — G4733 Obstructive sleep apnea (adult) (pediatric): Secondary | ICD-10-CM | POA: Diagnosis not present

## 2021-04-28 DIAGNOSIS — C61 Malignant neoplasm of prostate: Secondary | ICD-10-CM | POA: Diagnosis not present

## 2021-05-04 DIAGNOSIS — G4733 Obstructive sleep apnea (adult) (pediatric): Secondary | ICD-10-CM | POA: Diagnosis not present

## 2021-05-05 DIAGNOSIS — N5201 Erectile dysfunction due to arterial insufficiency: Secondary | ICD-10-CM | POA: Diagnosis not present

## 2021-05-05 DIAGNOSIS — C61 Malignant neoplasm of prostate: Secondary | ICD-10-CM | POA: Diagnosis not present

## 2021-05-05 DIAGNOSIS — R5383 Other fatigue: Secondary | ICD-10-CM | POA: Diagnosis not present

## 2021-05-06 ENCOUNTER — Other Ambulatory Visit: Payer: Self-pay | Admitting: Urology

## 2021-05-06 DIAGNOSIS — C61 Malignant neoplasm of prostate: Secondary | ICD-10-CM

## 2021-05-28 ENCOUNTER — Ambulatory Visit
Admission: RE | Admit: 2021-05-28 | Discharge: 2021-05-28 | Disposition: A | Discharge status: Home / Self Care | Payer: Federal, State, Local not specified - PPO | Source: Ambulatory Visit | Attending: Urology | Admitting: Urology

## 2021-05-28 ENCOUNTER — Other Ambulatory Visit: Payer: Self-pay

## 2021-05-28 DIAGNOSIS — C61 Malignant neoplasm of prostate: Secondary | ICD-10-CM

## 2021-05-28 DIAGNOSIS — R972 Elevated prostate specific antigen [PSA]: Secondary | ICD-10-CM | POA: Diagnosis not present

## 2021-05-28 MED ORDER — GADOBENATE DIMEGLUMINE 529 MG/ML IV SOLN
13.0000 mL | Freq: Once | INTRAVENOUS | Status: AC | PRN
  Administered 2021-05-28: 13 mL via INTRAVENOUS

## 2021-06-13 DIAGNOSIS — R300 Dysuria: Secondary | ICD-10-CM | POA: Diagnosis not present

## 2021-07-02 DIAGNOSIS — Z23 Encounter for immunization: Secondary | ICD-10-CM | POA: Diagnosis not present

## 2021-07-08 DIAGNOSIS — G4733 Obstructive sleep apnea (adult) (pediatric): Secondary | ICD-10-CM | POA: Diagnosis not present

## 2021-07-14 DIAGNOSIS — C61 Malignant neoplasm of prostate: Secondary | ICD-10-CM | POA: Diagnosis not present

## 2021-07-28 DIAGNOSIS — H00012 Hordeolum externum right lower eyelid: Secondary | ICD-10-CM | POA: Diagnosis not present

## 2021-07-29 DIAGNOSIS — Z85828 Personal history of other malignant neoplasm of skin: Secondary | ICD-10-CM | POA: Diagnosis not present

## 2021-07-29 DIAGNOSIS — L821 Other seborrheic keratosis: Secondary | ICD-10-CM | POA: Diagnosis not present

## 2021-07-29 DIAGNOSIS — R21 Rash and other nonspecific skin eruption: Secondary | ICD-10-CM | POA: Diagnosis not present

## 2021-07-29 DIAGNOSIS — D225 Melanocytic nevi of trunk: Secondary | ICD-10-CM | POA: Diagnosis not present

## 2021-07-29 DIAGNOSIS — D485 Neoplasm of uncertain behavior of skin: Secondary | ICD-10-CM | POA: Diagnosis not present

## 2021-07-29 DIAGNOSIS — L820 Inflamed seborrheic keratosis: Secondary | ICD-10-CM | POA: Diagnosis not present

## 2021-07-29 DIAGNOSIS — L570 Actinic keratosis: Secondary | ICD-10-CM | POA: Diagnosis not present

## 2021-07-29 DIAGNOSIS — D1801 Hemangioma of skin and subcutaneous tissue: Secondary | ICD-10-CM | POA: Diagnosis not present

## 2021-08-26 DIAGNOSIS — M25511 Pain in right shoulder: Secondary | ICD-10-CM | POA: Diagnosis not present

## 2021-08-31 DIAGNOSIS — H25043 Posterior subcapsular polar age-related cataract, bilateral: Secondary | ICD-10-CM | POA: Diagnosis not present

## 2021-08-31 DIAGNOSIS — H2513 Age-related nuclear cataract, bilateral: Secondary | ICD-10-CM | POA: Diagnosis not present

## 2021-08-31 DIAGNOSIS — H18413 Arcus senilis, bilateral: Secondary | ICD-10-CM | POA: Diagnosis not present

## 2021-08-31 DIAGNOSIS — H25013 Cortical age-related cataract, bilateral: Secondary | ICD-10-CM | POA: Diagnosis not present

## 2021-08-31 DIAGNOSIS — H2512 Age-related nuclear cataract, left eye: Secondary | ICD-10-CM | POA: Diagnosis not present

## 2021-09-03 DIAGNOSIS — H2511 Age-related nuclear cataract, right eye: Secondary | ICD-10-CM | POA: Diagnosis not present

## 2021-09-03 DIAGNOSIS — H2512 Age-related nuclear cataract, left eye: Secondary | ICD-10-CM | POA: Diagnosis not present

## 2021-09-09 DIAGNOSIS — H2512 Age-related nuclear cataract, left eye: Secondary | ICD-10-CM | POA: Diagnosis not present

## 2021-09-25 DIAGNOSIS — M25512 Pain in left shoulder: Secondary | ICD-10-CM | POA: Diagnosis not present

## 2021-09-25 DIAGNOSIS — M7542 Impingement syndrome of left shoulder: Secondary | ICD-10-CM | POA: Diagnosis not present

## 2021-10-30 DIAGNOSIS — J019 Acute sinusitis, unspecified: Secondary | ICD-10-CM | POA: Diagnosis not present
